# Patient Record
Sex: Female | Born: 1950 | Race: White | Hispanic: No | Marital: Single | Smoking: Never smoker
Health system: Southern US, Community
[De-identification: ages and names within clinical notes are randomized; demographics above are authoritative.]

## PROBLEM LIST (undated history)

## (undated) DIAGNOSIS — I10 Essential (primary) hypertension: Secondary | ICD-10-CM

## (undated) DIAGNOSIS — T8859XA Other complications of anesthesia, initial encounter: Secondary | ICD-10-CM

## (undated) DIAGNOSIS — Z972 Presence of dental prosthetic device (complete) (partial): Secondary | ICD-10-CM

## (undated) DIAGNOSIS — E785 Hyperlipidemia, unspecified: Secondary | ICD-10-CM

## (undated) DIAGNOSIS — I7300 Raynaud's syndrome without gangrene: Secondary | ICD-10-CM

## (undated) DIAGNOSIS — Z87442 Personal history of urinary calculi: Secondary | ICD-10-CM

## (undated) DIAGNOSIS — Z973 Presence of spectacles and contact lenses: Secondary | ICD-10-CM

## (undated) DIAGNOSIS — D649 Anemia, unspecified: Secondary | ICD-10-CM

## (undated) DIAGNOSIS — K219 Gastro-esophageal reflux disease without esophagitis: Secondary | ICD-10-CM

## (undated) DIAGNOSIS — F419 Anxiety disorder, unspecified: Secondary | ICD-10-CM

## (undated) DIAGNOSIS — T4145XA Adverse effect of unspecified anesthetic, initial encounter: Secondary | ICD-10-CM

## (undated) DIAGNOSIS — I739 Peripheral vascular disease, unspecified: Secondary | ICD-10-CM

## (undated) DIAGNOSIS — H269 Unspecified cataract: Secondary | ICD-10-CM

## (undated) DIAGNOSIS — W5911XA Bitten by nonvenomous snake, initial encounter: Secondary | ICD-10-CM

## (undated) DIAGNOSIS — R110 Nausea: Secondary | ICD-10-CM

## (undated) HISTORY — PX: BILATERAL TEMPOROMANDIBULAR JOINT ARTHROPLASTY: SUR77

## (undated) HISTORY — PX: KNEE ARTHROSCOPY: SUR90

## (undated) HISTORY — PX: ABDOMINAL HYSTERECTOMY: SHX81

## (undated) HISTORY — PX: ULNAR NERVE REPAIR: SHX2594

---

## 1999-04-16 ENCOUNTER — Other Ambulatory Visit: Admission: RE | Admit: 1999-04-16 | Discharge: 1999-04-16 | Payer: Self-pay | Admitting: Obstetrics and Gynecology

## 1999-05-14 ENCOUNTER — Ambulatory Visit (HOSPITAL_COMMUNITY): Admission: RE | Admit: 1999-05-14 | Discharge: 1999-05-14 | Payer: Self-pay | Admitting: Neurology

## 1999-05-18 ENCOUNTER — Encounter: Admission: RE | Admit: 1999-05-18 | Discharge: 1999-08-16 | Payer: Self-pay | Admitting: Anesthesiology

## 2000-02-01 ENCOUNTER — Encounter: Admission: RE | Admit: 2000-02-01 | Discharge: 2000-05-01 | Payer: Self-pay | Admitting: Family Medicine

## 2000-04-20 ENCOUNTER — Encounter: Admission: RE | Admit: 2000-04-20 | Discharge: 2000-04-20 | Payer: Self-pay | Admitting: Neurology

## 2000-04-20 ENCOUNTER — Encounter: Payer: Self-pay | Admitting: Neurology

## 2000-05-10 ENCOUNTER — Encounter: Admission: RE | Admit: 2000-05-10 | Discharge: 2000-05-30 | Payer: Self-pay | Admitting: Internal Medicine

## 2000-09-23 ENCOUNTER — Encounter: Admission: RE | Admit: 2000-09-23 | Discharge: 2000-12-22 | Payer: Self-pay | Admitting: Family Medicine

## 2000-12-27 ENCOUNTER — Other Ambulatory Visit: Admission: RE | Admit: 2000-12-27 | Discharge: 2000-12-27 | Payer: Self-pay | Admitting: *Deleted

## 2001-09-19 ENCOUNTER — Encounter: Payer: Self-pay | Admitting: Internal Medicine

## 2001-09-19 ENCOUNTER — Encounter: Admission: RE | Admit: 2001-09-19 | Discharge: 2001-09-19 | Payer: Self-pay | Admitting: Internal Medicine

## 2001-10-06 ENCOUNTER — Encounter: Admission: RE | Admit: 2001-10-06 | Discharge: 2001-10-06 | Payer: Self-pay | Admitting: Internal Medicine

## 2001-10-06 ENCOUNTER — Encounter: Payer: Self-pay | Admitting: Internal Medicine

## 2001-12-06 ENCOUNTER — Encounter: Payer: Self-pay | Admitting: Internal Medicine

## 2001-12-06 ENCOUNTER — Encounter: Admission: RE | Admit: 2001-12-06 | Discharge: 2001-12-06 | Payer: Self-pay | Admitting: Internal Medicine

## 2002-01-16 ENCOUNTER — Other Ambulatory Visit: Admission: RE | Admit: 2002-01-16 | Discharge: 2002-01-16 | Payer: Self-pay | Admitting: Obstetrics and Gynecology

## 2003-01-22 ENCOUNTER — Other Ambulatory Visit: Admission: RE | Admit: 2003-01-22 | Discharge: 2003-01-22 | Payer: Self-pay | Admitting: Obstetrics and Gynecology

## 2004-09-16 ENCOUNTER — Encounter: Admission: RE | Admit: 2004-09-16 | Discharge: 2004-09-16 | Payer: Self-pay | Admitting: Internal Medicine

## 2004-11-30 ENCOUNTER — Ambulatory Visit: Payer: Self-pay

## 2005-04-15 ENCOUNTER — Other Ambulatory Visit: Admission: RE | Admit: 2005-04-15 | Discharge: 2005-04-15 | Payer: Self-pay | Admitting: Obstetrics and Gynecology

## 2005-10-20 ENCOUNTER — Other Ambulatory Visit: Admission: RE | Admit: 2005-10-20 | Discharge: 2005-10-20 | Payer: Self-pay | Admitting: Obstetrics and Gynecology

## 2010-01-15 ENCOUNTER — Ambulatory Visit (HOSPITAL_COMMUNITY): Admission: RE | Admit: 2010-01-15 | Discharge: 2010-01-15 | Payer: Self-pay | Admitting: Interventional Radiology

## 2010-11-02 LAB — CBC
HCT: 39.2 % (ref 36.0–46.0)
MCHC: 34.9 g/dL (ref 30.0–36.0)
RBC: 4.48 MIL/uL (ref 3.87–5.11)
WBC: 8.8 10*3/uL (ref 4.0–10.5)

## 2010-11-02 LAB — BASIC METABOLIC PANEL
Chloride: 104 mEq/L (ref 96–112)
Creatinine, Ser: 0.87 mg/dL (ref 0.4–1.2)
GFR calc Af Amer: >60 mL/min (ref >60)
Sodium: 137 mEq/L (ref 135–145)

## 2010-11-02 LAB — PROTIME-INR: INR: 0.89 (ref 0.00–1.49)

## 2013-06-05 ENCOUNTER — Other Ambulatory Visit: Payer: Self-pay | Admitting: Internal Medicine

## 2013-06-05 ENCOUNTER — Ambulatory Visit
Admission: RE | Admit: 2013-06-05 | Discharge: 2013-06-05 | Disposition: A | Discharge status: Home / Self Care | Payer: Self-pay | Source: Ambulatory Visit | Attending: Internal Medicine | Admitting: Internal Medicine

## 2013-06-05 DIAGNOSIS — M25519 Pain in unspecified shoulder: Secondary | ICD-10-CM

## 2013-12-06 ENCOUNTER — Other Ambulatory Visit: Payer: Self-pay | Admitting: Internal Medicine

## 2013-12-06 ENCOUNTER — Ambulatory Visit
Admission: RE | Admit: 2013-12-06 | Discharge: 2013-12-06 | Disposition: A | Discharge status: Home / Self Care | Payer: 59 | Source: Ambulatory Visit | Attending: Internal Medicine | Admitting: Internal Medicine

## 2013-12-06 DIAGNOSIS — Z9181 History of falling: Secondary | ICD-10-CM

## 2013-12-12 ENCOUNTER — Other Ambulatory Visit: Payer: Self-pay | Admitting: Internal Medicine

## 2013-12-12 ENCOUNTER — Ambulatory Visit
Admission: RE | Admit: 2013-12-12 | Discharge: 2013-12-12 | Disposition: A | Discharge status: Home / Self Care | Payer: 59 | Source: Ambulatory Visit | Attending: Internal Medicine | Admitting: Internal Medicine

## 2013-12-12 DIAGNOSIS — M25559 Pain in unspecified hip: Secondary | ICD-10-CM

## 2013-12-26 ENCOUNTER — Ambulatory Visit
Admission: RE | Admit: 2013-12-26 | Discharge: 2013-12-26 | Disposition: A | Discharge status: Home / Self Care | Payer: 59 | Source: Ambulatory Visit | Attending: Internal Medicine | Admitting: Internal Medicine

## 2013-12-26 ENCOUNTER — Other Ambulatory Visit: Payer: Self-pay | Admitting: Internal Medicine

## 2013-12-26 DIAGNOSIS — M549 Dorsalgia, unspecified: Secondary | ICD-10-CM

## 2013-12-30 ENCOUNTER — Inpatient Hospital Stay (HOSPITAL_COMMUNITY)
Admission: EM | Admit: 2013-12-30 | Discharge: 2014-01-01 | DRG: 918 | Disposition: A | Discharge status: Home / Self Care | Payer: 59 | Attending: Internal Medicine | Admitting: Internal Medicine

## 2013-12-30 ENCOUNTER — Encounter (HOSPITAL_COMMUNITY): Payer: Self-pay | Admitting: Internal Medicine

## 2013-12-30 DIAGNOSIS — T6391XA Toxic effect of contact with unspecified venomous animal, accidental (unintentional), initial encounter: Principal | ICD-10-CM | POA: Diagnosis present

## 2013-12-30 DIAGNOSIS — IMO0002 Reserved for concepts with insufficient information to code with codable children: Secondary | ICD-10-CM

## 2013-12-30 DIAGNOSIS — T63001A Toxic effect of unspecified snake venom, accidental (unintentional), initial encounter: Secondary | ICD-10-CM | POA: Diagnosis present

## 2013-12-30 DIAGNOSIS — Y92009 Unspecified place in unspecified non-institutional (private) residence as the place of occurrence of the external cause: Secondary | ICD-10-CM

## 2013-12-30 DIAGNOSIS — E785 Hyperlipidemia, unspecified: Secondary | ICD-10-CM | POA: Diagnosis present

## 2013-12-30 DIAGNOSIS — Z79899 Other long term (current) drug therapy: Secondary | ICD-10-CM

## 2013-12-30 DIAGNOSIS — M545 Low back pain, unspecified: Secondary | ICD-10-CM | POA: Diagnosis present

## 2013-12-30 DIAGNOSIS — R112 Nausea with vomiting, unspecified: Secondary | ICD-10-CM | POA: Diagnosis present

## 2013-12-30 DIAGNOSIS — T63121A Toxic effect of venom of other venomous lizard, accidental (unintentional), initial encounter: Secondary | ICD-10-CM | POA: Diagnosis present

## 2013-12-30 DIAGNOSIS — W5911XA Bitten by nonvenomous snake, initial encounter: Secondary | ICD-10-CM | POA: Diagnosis present

## 2013-12-30 DIAGNOSIS — E876 Hypokalemia: Secondary | ICD-10-CM | POA: Diagnosis present

## 2013-12-30 DIAGNOSIS — Z7982 Long term (current) use of aspirin: Secondary | ICD-10-CM

## 2013-12-30 HISTORY — DX: Other complications of anesthesia, initial encounter: T88.59XA

## 2013-12-30 HISTORY — DX: Gastro-esophageal reflux disease without esophagitis: K21.9

## 2013-12-30 HISTORY — DX: Hyperlipidemia, unspecified: E78.5

## 2013-12-30 HISTORY — DX: Anemia, unspecified: D64.9

## 2013-12-30 HISTORY — DX: Adverse effect of unspecified anesthetic, initial encounter: T41.45XA

## 2013-12-30 HISTORY — DX: Bitten by nonvenomous snake, initial encounter: W59.11XA

## 2013-12-30 LAB — CBC WITH DIFFERENTIAL/PLATELET
BASOS ABS: 0 10*3/uL (ref 0.0–0.1)
BASOS PCT: 0 % (ref 0–1)
EOS ABS: 0 10*3/uL (ref 0.0–0.7)
Eosinophils Relative: 0 % (ref 0–5)
HCT: 38.7 % (ref 36.0–46.0)
Hemoglobin: 13.1 g/dL (ref 12.0–15.0)
LYMPHS ABS: 2.7 10*3/uL (ref 0.7–4.0)
Lymphocytes Relative: 23 % (ref 12–46)
MCH: 29.8 pg (ref 26.0–34.0)
MCHC: 33.9 g/dL (ref 30.0–36.0)
MCV: 88 fL (ref 78.0–100.0)
MONO ABS: 0.7 10*3/uL (ref 0.1–1.0)
MONOS PCT: 6 % (ref 3–12)
NEUTROS ABS: 8.2 10*3/uL — AB (ref 1.7–7.7)
NEUTROS PCT: 71 % (ref 43–77)
PLATELETS: 211 10*3/uL (ref 150–400)
RBC: 4.4 MIL/uL (ref 3.87–5.11)
RDW: 12.6 % (ref 11.5–15.5)
WBC: 11.6 10*3/uL — ABNORMAL HIGH (ref 4.0–10.5)

## 2013-12-30 LAB — COMPREHENSIVE METABOLIC PANEL
ALBUMIN: 3.9 g/dL (ref 3.5–5.2)
ALT: 22 U/L (ref 0–35)
AST: 20 U/L (ref 0–37)
Alkaline Phosphatase: 62 U/L (ref 39–117)
BILIRUBIN TOTAL: 0.2 mg/dL — AB (ref 0.3–1.2)
BUN: 27 mg/dL — ABNORMAL HIGH (ref 6–23)
CO2: 25 mEq/L (ref 19–32)
Calcium: 9 mg/dL (ref 8.4–10.5)
Chloride: 107 mEq/L (ref 96–112)
Creatinine, Ser: 1.11 mg/dL — ABNORMAL HIGH (ref 0.50–1.10)
GFR calc Af Amer: 59 mL/min — ABNORMAL LOW (ref >90)
GFR calc non Af Amer: 51 mL/min — ABNORMAL LOW (ref >90)
GLUCOSE: 90 mg/dL (ref 70–99)
Potassium: 3 mEq/L — ABNORMAL LOW (ref 3.7–5.3)
SODIUM: 147 meq/L (ref 137–147)
TOTAL PROTEIN: 6.6 g/dL (ref 6.0–8.3)

## 2013-12-30 LAB — FIBRINOGEN: FIBRINOGEN: 209 mg/dL (ref 204–475)

## 2013-12-30 LAB — CK: CK TOTAL: 162 U/L (ref 7–177)

## 2013-12-30 LAB — D-DIMER, QUANTITATIVE: D-Dimer, Quant: 0.33 ug/mL-FEU (ref 0.00–0.48)

## 2013-12-30 LAB — PROTIME-INR
INR: 0.96 (ref 0.00–1.49)
PROTHROMBIN TIME: 12.6 s (ref 11.6–15.2)

## 2013-12-30 MED ORDER — SODIUM CHLORIDE 0.9 % IV BOLUS (SEPSIS)
1000.0000 mL | Freq: Once | INTRAVENOUS | Status: AC
  Administered 2013-12-30: 1000 mL via INTRAVENOUS

## 2013-12-30 MED ORDER — ALBUTEROL SULFATE (2.5 MG/3ML) 0.083% IN NEBU: 2.5000 mL | INHALATION_SOLUTION | Freq: Four times a day (QID) | RESPIRATORY_TRACT | Status: DC | PRN

## 2013-12-30 MED ORDER — ONDANSETRON HCL 4 MG/2ML IJ SOLN
4.0000 mg | Freq: Once | INTRAMUSCULAR | Status: AC
  Administered 2013-12-30: 4 mg via INTRAVENOUS
  Filled 2013-12-30: qty 2

## 2013-12-30 MED ORDER — HYDROMORPHONE HCL PF 1 MG/ML IJ SOLN
1.0000 mg | Freq: Once | INTRAMUSCULAR | Status: AC
  Administered 2013-12-30: 1 mg via INTRAVENOUS
  Filled 2013-12-30: qty 1

## 2013-12-30 MED ORDER — ACETAMINOPHEN 650 MG RE SUPP: 650.0000 mg | Freq: Four times a day (QID) | RECTAL | Status: DC | PRN

## 2013-12-30 MED ORDER — TETANUS-DIPHTHERIA TOXOIDS TD 5-2 LFU IM INJ
0.5000 mL | INJECTION | Freq: Once | INTRAMUSCULAR | Status: AC
  Administered 2013-12-30: 0.5 mL via INTRAMUSCULAR
  Filled 2013-12-30: qty 0.5

## 2013-12-30 MED ORDER — SODIUM CHLORIDE 0.9 % IV SOLN
1000.0000 mL | INTRAVENOUS | Status: AC
  Administered 2013-12-30: 1000 mL via INTRAVENOUS

## 2013-12-30 MED ORDER — SODIUM CHLORIDE 0.9 % IV SOLN
4.0000 | Freq: Once | INTRAVENOUS | Status: AC
  Administered 2013-12-30: 72 mL via INTRAVENOUS
  Filled 2013-12-30: qty 72

## 2013-12-30 MED ORDER — METOCLOPRAMIDE HCL 5 MG/ML IJ SOLN
10.0000 mg | Freq: Once | INTRAMUSCULAR | Status: AC
  Administered 2013-12-30: 10 mg via INTRAVENOUS
  Filled 2013-12-30: qty 2

## 2013-12-30 MED ORDER — HYDROMORPHONE HCL PF 1 MG/ML IJ SOLN: 1.0000 mg | INTRAMUSCULAR | Status: DC | PRN

## 2013-12-30 MED ORDER — ONDANSETRON HCL 4 MG PO TABS: 4.0000 mg | ORAL_TABLET | Freq: Four times a day (QID) | ORAL | Status: DC | PRN

## 2013-12-30 MED ORDER — PREDNISONE 20 MG PO TABS
40.0000 mg | ORAL_TABLET | Freq: Every day | ORAL | Status: DC
  Filled 2013-12-30 (×2): qty 2

## 2013-12-30 MED ORDER — PROMETHAZINE HCL 25 MG PO TABS: 12.5000 mg | ORAL_TABLET | Freq: Four times a day (QID) | ORAL | Status: DC | PRN

## 2013-12-30 MED ORDER — SODIUM CHLORIDE 0.9 % IV SOLN: 4.0000 | Freq: Once | INTRAVENOUS | Status: DC

## 2013-12-30 MED ORDER — HYDROCODONE-ACETAMINOPHEN 5-325 MG PO TABS
1.0000 | ORAL_TABLET | ORAL | Status: DC | PRN
  Administered 2013-12-31 (×2): 1 via ORAL
  Filled 2013-12-30 (×2): qty 1

## 2013-12-30 MED ORDER — ACETAMINOPHEN 325 MG PO TABS: 650.0000 mg | ORAL_TABLET | Freq: Four times a day (QID) | ORAL | Status: DC | PRN

## 2013-12-30 MED ORDER — ONDANSETRON HCL 4 MG/2ML IJ SOLN: 4.0000 mg | Freq: Four times a day (QID) | INTRAMUSCULAR | Status: DC | PRN

## 2013-12-30 MED ORDER — SODIUM CHLORIDE 0.9 % IJ SOLN: 3.0000 mL | Freq: Two times a day (BID) | INTRAMUSCULAR | Status: DC

## 2013-12-30 MED ORDER — PROMETHAZINE HCL 25 MG/ML IJ SOLN
12.5000 mg | Freq: Four times a day (QID) | INTRAMUSCULAR | Status: DC | PRN
  Administered 2013-12-31: 12.5 mg via INTRAVENOUS
  Filled 2013-12-30: qty 1

## 2013-12-30 MED ORDER — PROMETHAZINE HCL 25 MG RE SUPP: 12.5000 mg | Freq: Four times a day (QID) | RECTAL | Status: DC | PRN

## 2013-12-30 MED ORDER — POTASSIUM CHLORIDE 10 MEQ/100ML IV SOLN
10.0000 meq | INTRAVENOUS | Status: AC
  Administered 2013-12-30 – 2013-12-31 (×2): 10 meq via INTRAVENOUS
  Filled 2013-12-30 (×3): qty 100

## 2013-12-30 NOTE — ED Provider Notes (Signed)
I saw and evaluated the patient, reviewed the resident's note and I agree with the findings and plan.   EKG Interpretation   Date/Time:  Sunday Dec 30 2013 18:01:26 EDT Ventricular Rate:  67 PR Interval:  120 QRS Duration: 87 QT Interval:  407 QTC Calculation: 430 R Axis:   64 Text Interpretation:  Sinus rhythm No old tracing to compare Confirmed by  [facility]  MD, TREY ([id number]) on 12/30/2013 7:03:04 PM      CRITICAL CARE Performed by: Candyce ChurnJohn David Caidence Higashi III   Total critical care time: 30  Critical care time was exclusive of separately billable procedures and treating other patients.  Critical care was necessary to treat or prevent imminent or life-threatening deterioration.  Critical care was time spent personally by me on the following activities: development of treatment plan with patient and/or surrogate as well as nursing, discussions with consultants, evaluation of patient's response to treatment, examination of patient, obtaining history from patient or surrogate, ordering and performing treatments and interventions, ordering and review of laboratory studies, ordering and review of radiographic studies, pulse oximetry and re-evaluation of patient's condition.   63 yo female who was bit by a snake, thought to be a copperhead, shortly before arrival. Exam, small puncture wound to the dorsal aspect of right hand, associated swelling of hand fingers and distal forearm. 2+ radial pulses, sensation intact, motor intact. Plan to observe, monitor swelling, consider CroFab if symptoms progress.  Swelling progressed. Required CroFab. Admitted.  Clinical Impression: 1. Hyperlipidemia   2. Snake bite   3. Hypokalemia   4. Nausea and vomiting       Candyce ChurnJohn David Mekiah Cambridge III, MD 12/31/13 [phone]

## 2013-12-30 NOTE — ED Provider Notes (Signed)
CSN: [id number]     Arrival date & time 12/30/13  1745 History   First MD Initiated Contact with Patient 12/30/13 1746     Chief Complaint  Patient presents with  . Snake Bite     (Consider location/radiation/quality/duration/timing/severity/associated sxs/prior Treatment) Patient is a 63 y.o. female presenting with animal bite. The history is provided by the patient and medical records. No language interpreter was used.  Animal Bite Contact animal:  Snake Location:  Hand Hand injury location:  R hand Time since incident:  1 hour Pain details:    Quality:  Aching   Severity:  Severe   Timing:  Constant   Progression:  Worsening Incident location:  Home Provoked: unprovoked   Notifications:  None Tetanus status:  Out of date Relieved by:  Nothing Worsened by:  Activity Ineffective treatments:  Rest Associated symptoms: numbness and swelling   Associated symptoms: no fever     No past medical history on file. No past surgical history on file. No family history on file. History  Substance Use Topics  . Smoking status: Not on file  . Smokeless tobacco: Not on file  . Alcohol Use: Not on file   OB History   No data available     Review of Systems  Constitutional: Negative for fever.  Neurological: Positive for numbness.  All other systems reviewed and are negative.     Allergies  Review of patient's allergies indicates not on file.  Home Medications   Prior to Admission medications   Not on File   SpO2 98% Physical Exam  Nursing note and vitals reviewed. Constitutional: She is oriented to person, place, and time. She appears well-developed and well-nourished.  HENT:  Head: Normocephalic and atraumatic.  Right Ear: External ear normal.  Left Ear: External ear normal.  Eyes: Conjunctivae are normal. Pupils are equal, round, and reactive to light.  Neck: Normal range of motion. Neck supple.  Cardiovascular: Normal rate, normal heart sounds and intact  distal pulses.   Pulmonary/Chest: Effort normal and breath sounds normal.  Abdominal: Soft. Bowel sounds are normal.  Musculoskeletal:  Right hand - to dorsum of hand (web space) there are 2 small puncture wounds, appx 2 mm, c/w bite.  There is TTP and moderate edema to level of mid forearm. 2+ distal pulses and normal sensation.    Neurological: She is alert and oriented to person, place, and time.    ED Course  Procedures (including critical care time) Labs Review Labs Reviewed  CBC WITH DIFFERENTIAL - Abnormal; Notable for the following:    WBC 11.6 (*)    Neutro Abs 8.2 (*)    All other components within normal limits  COMPREHENSIVE METABOLIC PANEL - Abnormal; Notable for the following:    Potassium 3.0 (*)    BUN 27 (*)    Creatinine, Ser 1.11 (*)    Total Bilirubin 0.2 (*)    GFR calc non Af Amer 51 (*)    GFR calc Af Amer 59 (*)    All other components within normal limits  PROTIME-INR  CK  D-DIMER, QUANTITATIVE  FIBRINOGEN  CBC  D-DIMER, QUANTITATIVE  PROTIME-INR    Imaging Review No results found.   EKG Interpretation   Date/Time:  Sunday Dec 30 2013 18:01:26 EDT Ventricular Rate:  67 PR Interval:  120 QRS Duration: 87 QT Interval:  407 QTC Calculation: 430 R Axis:   64 Text Interpretation:  Sinus rhythm No old tracing to compare Confirmed by  [facility]  MD, Thurston PoundsREY [phone]([id number]) on 12/30/2013 7:03:04 PM         Date: 12/30/2013  Rate: 67  Rhythm: normal sinus rhythm  QRS Axis: normal  Intervals: normal  ST/T Wave abnormalities: normal  Conduction Disutrbances:none  Narrative Interpretation:   Old EKG Reviewed: none available   MDM   Final diagnoses:  Hyperlipidemia  Snake bite  Hypokalemia  Nausea and vomiting    Patient presents to the emergency department with complaints of right upper extremity pain/swelling after a snake bite. Family has picture of snake and it is consistent with copperhead. Vital signs were unremarkable including no hypoxia,  no fever, and no tachycardia. Physical exam as above and remarkable for marked edema and tenderness to palpation of the right upper extremity.  Patient was observed in the emergency department and she was noted to have increasing amounts of swelling, tenderness to palpation, and ecchymosis. Poison control made aware of patient's presentation and it was thought that CroFab was appropriate. Patient will be admitted to the hospitalist for further monitoring and serial blood work.      Johnney Ouerek Latonya Nelon, MD 12/31/13 [phone]

## 2013-12-30 NOTE — ED Notes (Addendum)
Pt tolerated first ten minutes of CROFAB well. No new signs/symptoms.

## 2013-12-30 NOTE — ED Notes (Addendum)
WOUND MEASUREMENTS: 2100 Hand 20.5cm Wrist 16.5cm Forearm 25cm Bicep 30cm 2200 Hand 20.5cm Wrist 16.5cm Forearm 23cm Bicep 30.5cm 2250 Hand 20.0cm Wrist 16.5cm Forearm 23.5cm Bicep 31cm

## 2013-12-30 NOTE — Progress Notes (Signed)
Pt arrive to room 2w17 with RN. Per emergency room report if pt experiences increased swelling, I am to contact Poison Control first for suggestions before contacting MD for orders based on Poison Control suggestions. Pt family at bedside, call light in reach, RN will continue to monitor.   Thane EduKimberly Hecker, RN

## 2013-12-30 NOTE — ED Notes (Signed)
[facility] with c/o copperhead snake bite while moving wood at around 5 pm.  EMS has swelling marked and timed.  Pt reports throbbing 10/10 pain, A&O, in NAD.

## 2013-12-30 NOTE — H&P (Signed)
Patient's PCP: Alyssa James,KARRAR, Alyssa James  Chief Complaint: Snake bite  History of Present Illness: Alyssa James is a 63 y.o. Caucasian female this history of hyperlipidemia which is diet controlled and recent history of fall with low back pain and possible nerve impingement on steroids who presents with the above complaints.  Patient reports that her symptoms started at 5 p.m. today while she was in her backyard, she was lifting a tarp on some wood, a snake surprised her and bit her right hand on the dorsum.  Patient killed the snake and took pictures, picture suggestive of a copperhead snake.  She presented to the emergency department for further evaluation.  In the emergency department, due to her right upper extremity swelling she was given a dose of crofab. Hospitalist service was asked to admit the patient for further care and management.  Patient denies any recent fevers, chills, nausea, vomiting, chest pain, shortness of breath, abdominal pain, diarrhea, headaches or vision changes prior to the snake bite.  Subsequently after snake bite, she has had nausea and vomiting.  She also has had increased swelling of her right upper extremity.  Of note, she has fallen about one month ago and has subsequent low back pain and has been started on prednisone on 12/26/2013 by her primary care physician.  Review of Systems: All systems reviewed with the patient and positive as per history of present illness, otherwise all other systems are negative.  Past Medical History  Diagnosis Date  . Hyperlipidemia    History reviewed. No pertinent past surgical history. Family History  Problem Relation Age of Onset  . Leukemia Mother   . Scoliosis Mother   . Arthritis Mother   . Heart attack Brother   . Heart attack Brother    History   Social History  . Marital Status: Single    Spouse Name: N/A    Number of Children: N/A  . Years of Education: N/A   Occupational History  . Not on file.   Social History  Main Topics  . Smoking status: Never Smoker   . Smokeless tobacco: Not on file  . Alcohol Use: No  . Drug Use: Not on file  . Sexual Activity: Not on file   Other Topics Concern  . Not on file   Social History Narrative  . No narrative on file   Allergies: Aspirin; Codeine; Sulfa antibiotics; Tetracyclines & related; and Penicillins  Home Meds: Prior to Admission medications   Medication Sig Start Date End Date Taking? Authorizing Provider  albuterol (PROVENTIL HFA;VENTOLIN HFA) 108 (90 BASE) MCG/ACT inhaler Inhale 2 puffs into the lungs every 6 (six) hours as needed for wheezing or shortness of breath.   Yes Historical Provider, Alyssa James  aspirin 81 MG chewable tablet Chew by mouth daily.   Yes Historical Provider, Alyssa James  Cholecalciferol (VITAMIN D3 ADULT GUMMIES PO) Take 1 tablet by mouth daily.   Yes Historical Provider, Alyssa James  ibuprofen (ADVIL,MOTRIN) 800 MG tablet Take 800 mg by mouth every 8 (eight) hours as needed for headache.   Yes Historical Provider, Alyssa James  OVER THE COUNTER MEDICATION Take 1 tablet by mouth daily. Calcium gummies   Yes Historical Provider, Alyssa James  OVER THE COUNTER MEDICATION Take 1 tablet by mouth daily. Fish oil gummies   Yes Historical Provider, Alyssa James  PREDNISONE PO Take by mouth.   Yes Historical Provider, Alyssa James    Physical Exam: Blood pressure 154/93, pulse 69, temperature 98 F (36.7 C), temperature source Oral, resp. rate 18, height  5\' 1"  (1.549 m), weight 57.607 kg (127 lb), SpO2 98.00%. General: Awake, Oriented x3, No acute distress. HEENT: EOMI, Moist mucous membranes Neck: Supple CV: S1 and S2 Lungs: Clear to ascultation bilaterally Abdomen: Soft, Nontender, Nondistended, +bowel sounds. Ext: Good pulses. No clubbing or cyanosis noted.  Right upper extremity swelling all the way to the axilla.  2 puncture wounds noted on the right dorsum of the hand. Neuro: Cranial Nerves II-XII grossly intact. Has 5/5 motor strength in upper and lower extremities.  Lab  results:  Recent Labs  12/30/13 1827  NA 147  K 3.0*  CL 107  CO2 25  GLUCOSE 90  BUN 27*  CREATININE 1.11*  CALCIUM 9.0    Recent Labs  12/30/13 1827  AST 20  ALT 22  ALKPHOS 62  BILITOT 0.2*  PROT 6.6  ALBUMIN 3.9   No results found for this basename: LIPASE, AMYLASE,  in the last 72 hours  Recent Labs  12/30/13 1827  WBC 11.6*  NEUTROABS 8.2*  HGB 13.1  HCT 38.7  MCV 88.0  PLT 211    Recent Labs  12/30/13 1827  CKTOTAL 162   No components found with this basename: POCBNP,   Recent Labs  12/30/13 1827  DDIMER 0.33   No results found for this basename: HGBA1C,  in the last 72 hours No results found for this basename: CHOL, HDL, LDLCALC, TRIG, CHOLHDL, LDLDIRECT,  in the last 72 hours No results found for this basename: TSH, T4TOTAL, FREET3, T3FREE, THYROIDAB,  in the last 72 hours No results found for this basename: VITAMINB12, FOLATE, FERRITIN, TIBC, IRON, RETICCTPCT,  in the last 72 hours Imaging results:  Dg Lumbar Spine 2-3 Views  12/26/2013   CLINICAL DATA:  Larey SeatFell and a pleural with left hip pain  EXAM: LUMBAR SPINE - 2-3 VIEW  COMPARISON:  None.  FINDINGS: The lumbar vertebrae are in normal alignment. Intervertebral disc spaces appear normal. There does appear to be degenerative change involving the facet joints particularly of L3-4 and L4-5, and L5-S1 levels. The SI joints are corticated.  IMPRESSION: Normal alignment with normal disc spaces. Degenerative change of the facet joints of the lower lumbar spine.   Electronically Signed   By: Dwyane DeePaul  Barry M.D.   On: 12/26/2013 16:01   Dg Pelvis 1-2 Views  12/06/2013   CLINICAL DATA:  Pelvic pain following fall.  EXAM: PELVIS - 1-2 VIEW  COMPARISON:  None.  FINDINGS: There is no evidence of pelvic fracture or diastasis. No other pelvic bone lesions are seen.  IMPRESSION: Negative.   Electronically Signed   By: Laveda AbbeJeff  Hu M.D.   On: 12/06/2013 15:49   Dg Hip Complete Left  12/12/2013   CLINICAL DATA:  Larey SeatFell 2  weeks ago with pain  EXAM: LEFT HIP - COMPLETE 2+ VIEW  COMPARISON:  None.  FINDINGS: No acute fracture is seen. The left hip joint space is unremarkable. The left pelvic ramus is intact.  IMPRESSION: Negative.   Electronically Signed   By: Dwyane DeePaul  Barry M.D.   On: 12/12/2013 14:34   Other results: EKG: Sinus rhythm with heart rate 67.  Assessment & Plan by Problem: Snake bite of right upper extremity Case discussed with poison control.  Continue right upper extremity hand measurements as per poison control.  Patient has received 1 dose of CroFab.  Will check CBC, d-dimer, and INR 6 hours later.  Nursing staff to check right upper extremity measurements every one hour, if any increasing swelling then  will administer another dose of CroFab.  Patient to elevate her arm and straight as possible above the heart.  Poison control recommended monitoring the patient on telemetry.  Control pain with dialudid and hydrocodone as per poison control.  Nausea and vomiting Due to above.  Control symptoms with antiemetics.  Clear liquid diet for now, if nausea and vomiting resolves by tomorrow and start regular diet.  Hypokalemia May be due to snake bite.  Replace as needed.  Hyperlipidemia Appears to be diet controlled at home.  Not an active issue at this time.  Low back pain from a fall one month ago Continue steroids prescribed by her primary care physician.  Steroid titration as per her primary care physician.  Patient indicates that she has had multiple imaging which have all been negative for any fractures.  Prophylaxis SCDs.  No heparin given concern for possible bleeding from the snake bite.  CODE STATUS Full code.  Disposition Admit the patient to telemetry as inpatient.  In the morning, patient's care will be transferred to her primary care physician Dr. Gloris Ham.  Time spent on admission, talking to the patient, and coordinating care was: 60 mins.  Cristal Ford, Alyssa James 12/30/2013, 10:42 PM

## 2013-12-31 ENCOUNTER — Encounter (HOSPITAL_COMMUNITY): Payer: Self-pay | Admitting: General Practice

## 2013-12-31 LAB — BASIC METABOLIC PANEL
BUN: 17 mg/dL (ref 6–23)
CHLORIDE: 107 meq/L (ref 96–112)
CO2: 25 meq/L (ref 19–32)
Calcium: 7.2 mg/dL — ABNORMAL LOW (ref 8.4–10.5)
Creatinine, Ser: 0.82 mg/dL (ref 0.50–1.10)
GFR calc Af Amer: 86 mL/min — ABNORMAL LOW (ref >90)
GFR calc non Af Amer: 74 mL/min — ABNORMAL LOW (ref >90)
Glucose, Bld: 85 mg/dL (ref 70–99)
POTASSIUM: 3.6 meq/L — AB (ref 3.7–5.3)
Sodium: 142 mEq/L (ref 137–147)

## 2013-12-31 LAB — CBC
HCT: 35.5 % — ABNORMAL LOW (ref 36.0–46.0)
HEMATOCRIT: 36.5 % (ref 36.0–46.0)
HEMOGLOBIN: 11.7 g/dL — AB (ref 12.0–15.0)
Hemoglobin: 12.1 g/dL (ref 12.0–15.0)
MCH: 29.4 pg (ref 26.0–34.0)
MCH: 29.5 pg (ref 26.0–34.0)
MCHC: 33 g/dL (ref 30.0–36.0)
MCHC: 33.2 g/dL (ref 30.0–36.0)
MCV: 89.2 fL (ref 78.0–100.0)
MCV: 89 fL (ref 78.0–100.0)
Platelets: 212 10*3/uL (ref 150–400)
Platelets: 204 10*3/uL (ref 150–400)
RBC: 3.98 MIL/uL (ref 3.87–5.11)
RBC: 4.1 MIL/uL (ref 3.87–5.11)
RDW: 12.9 % (ref 11.5–15.5)
RDW: 13 % (ref 11.5–15.5)
WBC: 11.2 10*3/uL — ABNORMAL HIGH (ref 4.0–10.5)
WBC: 10 10*3/uL (ref 4.0–10.5)

## 2013-12-31 LAB — D-DIMER, QUANTITATIVE: D-Dimer, Quant: 0.47 ug/mL-FEU (ref 0.00–0.48)

## 2013-12-31 LAB — PROTIME-INR
INR: 1.02 (ref 0.00–1.49)
PROTHROMBIN TIME: 13.2 s (ref 11.6–15.2)

## 2013-12-31 MED ORDER — POTASSIUM CHLORIDE CRYS ER 20 MEQ PO TBCR
20.0000 meq | EXTENDED_RELEASE_TABLET | Freq: Once | ORAL | Status: AC
  Administered 2013-12-31: 20 meq via ORAL
  Filled 2013-12-31: qty 1

## 2013-12-31 NOTE — Progress Notes (Addendum)
2031 Hand 20 cm  Wrist 16.5 cm  Forearm 23.5 cm  Bicep 30 cm 0016 Hand 20 Wrist 16.5 Forearm 22.5 Bicep 30 cm 0409 Hand 20 cm Wrist 16.5 cm Forearm 23 cm Bicep 30 cm

## 2013-12-31 NOTE — Progress Notes (Signed)
RN contact Poison Control to talk about increase of 9 cm in pt bicep measurement.  Representative state it is expected to increase some and there is no "magic number" in increased girth of area that brings concern just monitoring the pts symptoms at this time.  Suggested that we increase elevation of pt's arm and reevaluate in hour and half to 2 hours to see if there is any change in swelling.  Pt is asymptomatic at this time as long as arm is not being moved or bothered at this time.  Family at bedside, call light in reach, RN will continue to monitor.   Thane EduKimberly Hecker, RN

## 2013-12-31 NOTE — ED Provider Notes (Signed)
I saw and evaluated the patient, reviewed the resident's note and I agree with the findings and plan.   EKG Interpretation   Date/Time:  Sunday Dec 30 2013 18:01:26 EDT Ventricular Rate:  67 PR Interval:  120 QRS Duration: 87 QT Interval:  407 QTC Calculation: 430 R Axis:   64 Text Interpretation:  Sinus rhythm No old tracing to compare Confirmed by  [facility]  [facility], TREY ([id number]) on 12/30/2013 7:03:04 PM        Candyce ChurnJohn David Tyffani Foglesong III, MD 12/31/13 1246

## 2013-12-31 NOTE — Progress Notes (Addendum)
1046 Hand 20.5 cm Wrist 16.5 cm Forearm 25.5 cm Bicep 30.5 cm 1421 Hand 20 cm Wrist 16.5 Forearm 25 cm Bicep 32 cm 1735 Hand 20 cm Wrist 16.5 cm Forearm 24 cm Bicep 31 cm

## 2013-12-31 NOTE — Progress Notes (Signed)
Subjective: Right arm pain better- still with swelling No N/V  Objective: Vital signs in last 24 hours: Temp:  [97.5 F (36.4 C)-98 F (36.7 C)] 97.5 F (36.4 C) (05/17 2355) Pulse Rate:  [59-82] 62 (05/17 2355) Resp:  [8-19] 16 (05/17 2355) BP: (118-170)/(71-123) 155/92 mmHg (05/17 2355) SpO2:  [88 %-100 %] 98 % (05/17 2355) Weight:  [57.607 kg (127 lb)-57.834 kg (127 lb 8 oz)] 57.834 kg (127 lb 8 oz) (05/17 2355) Weight change:  Last BM Date: 12/30/13  Intake/Output from previous day:   Intake/Output this shift:    General appearance: alert Resp: clear to auscultation bilaterally Cardio: regular rate and rhythm Extremities: right arm swelling- able to move fingers  Lab Results:  Recent Labs  12/30/13 1827 12/31/13 0400  WBC 11.6* 11.2*  HGB 13.1 11.7*  HCT 38.7 35.5*  PLT 211 212   BMET  Recent Labs  12/30/13 1827 12/31/13 0400  NA 147 142  K 3.0* 3.6*  CL 107 107  CO2 25 25  GLUCOSE 90 85  BUN 27* 17  CREATININE 1.11* 0.82  CALCIUM 9.0 7.2*    Studies/Results: No results found.  Medications: I have reviewed the patient's current medications.  Assessment/Plan: Snake bite to right arm- with local reaction- s/p dose of CroFab; poison control for further recommendation; continue supportive care - elevate arm- neurovascular ok-  Hand ortho to eval Low K- replace Back pain- was on prednisone at home- hold off due to risk for healing- snake bite- per poison control Clear diet with advance as tolerated   LOS: 1 day   Georgann HousekeeperKarrar Kendria Halberg 12/31/2013, 7:33 AM

## 2013-12-31 NOTE — Progress Notes (Signed)
Spoke with poison control via telephone-poison control suggested: No repeat of labs; no ice; pain management; optimize elevation; will follow up later in the day.  Needed for hospitalization at least 6 hours post bite and treatment; ok to discharge if patient is able to keep extremity elevated and feels comfortable with discharge; poison control asks to call with any questions about discharging patient.  Judeth CornfieldStephanie (name of contact spoken to at [phone])  Park BreedHeather M Barnett, RN

## 2013-12-31 NOTE — Progress Notes (Signed)
Spoke with representative from Poison Control to give up date.  Pt resting with arm elevated on pillows with elbow bent at 30-45 degree angle.  Swelling has not increased.  Per Poison Control, will have pt added to list for follow up in the morning.   Thane EduKimberly Hecker, RN

## 2013-12-31 NOTE — Consult Note (Signed)
Reason for Consult:right hand snakebite Referring Physician: [name] is an 63 y.o. female.  HPI: as above s/p snakebite to right hand on 5/17 with pain and swelling  Past Medical History  Diagnosis Date  . Hyperlipidemia     History reviewed. No pertinent past surgical history.  Family History  Problem Relation Age of Onset  . Leukemia Mother   . Scoliosis Mother   . Arthritis Mother   . Heart attack Brother   . Heart attack Brother     Social History:  reports that she has never smoked. She does not have any smokeless tobacco history on file. She reports that she does not drink alcohol. Her drug history is not on file.  Allergies:  Allergies  Allergen Reactions  . Aspirin Other (See Comments)    Unknown reaction.  Takes baby aspirin now and it's fine  . Codeine Other (See Comments)    Headache  . Sulfa Antibiotics Nausea And Vomiting  . Tetracyclines & Related Nausea And Vomiting and Other (See Comments)    Headache  . Penicillins Rash    Medications:  Scheduled: . sodium chloride  3 mL Intravenous Q12H    Results for orders placed during the hospital encounter of 12/30/13 (from the past 48 hour(s))  CBC WITH DIFFERENTIAL     Status: Abnormal   Collection Time    12/30/13  6:27 PM      Result Value Ref Range   WBC 11.6 (*) 4.0 - 10.5 K/uL   RBC 4.40  3.87 - 5.11 MIL/uL   Hemoglobin 13.1  12.0 - 15.0 g/dL   HCT 38.7  36.0 - 46.0 %   MCV 88.0  78.0 - 100.0 fL   MCH 29.8  26.0 - 34.0 pg   MCHC 33.9  30.0 - 36.0 g/dL   RDW 12.6  11.5 - 15.5 %   Platelets 211  150 - 400 K/uL   Neutrophils Relative % 71  43 - 77 %   Neutro Abs 8.2 (*) 1.7 - 7.7 K/uL   Lymphocytes Relative 23  12 - 46 %   Lymphs Abs 2.7  0.7 - 4.0 K/uL   Monocytes Relative 6  3 - 12 %   Monocytes Absolute 0.7  0.1 - 1.0 K/uL   Eosinophils Relative 0  0 - 5 %   Eosinophils Absolute 0.0  0.0 - 0.7 K/uL   Basophils Relative 0  0 - 1 %   Basophils Absolute 0.0  0.0 - 0.1 K/uL   COMPREHENSIVE METABOLIC PANEL     Status: Abnormal   Collection Time    12/30/13  6:27 PM      Result Value Ref Range   Sodium 147  137 - 147 mEq/L   Potassium 3.0 (*) 3.7 - 5.3 mEq/L   Chloride 107  96 - 112 mEq/L   CO2 25  19 - 32 mEq/L   Glucose, Bld 90  70 - 99 mg/dL   BUN 27 (*) 6 - 23 mg/dL   Creatinine, Ser 1.11 (*) 0.50 - 1.10 mg/dL   Calcium 9.0  8.4 - 10.5 mg/dL   Total Protein 6.6  6.0 - 8.3 g/dL   Albumin 3.9  3.5 - 5.2 g/dL   AST 20  0 - 37 U/L   ALT 22  0 - 35 U/L   Alkaline Phosphatase 62  39 - 117 U/L   Total Bilirubin 0.2 (*) 0.3 - 1.2 mg/dL   GFR calc non  Af Amer 51 (*) >90 mL/min   GFR calc Af Amer 59 (*) >90 mL/min   Comment: (NOTE)     The eGFR has been calculated using the CKD EPI equation.     This calculation has not been validated in all clinical situations.     eGFR's persistently <90 mL/min signify possible Chronic Kidney     Disease.  PROTIME-INR     Status: None   Collection Time    12/30/13  6:27 PM      Result Value Ref Range   Prothrombin Time 12.6  11.6 - 15.2 seconds   INR 0.96  0.00 - 1.49  CK     Status: None   Collection Time    12/30/13  6:27 PM      Result Value Ref Range   Total CK 162  7 - 177 U/L  D-DIMER, QUANTITATIVE     Status: None   Collection Time    12/30/13  6:27 PM      Result Value Ref Range   D-Dimer, Quant 0.33  0.00 - 0.48 ug/mL-FEU   Comment:            AT THE INHOUSE ESTABLISHED CUTOFF     VALUE OF 0.48 ug/mL FEU,     THIS ASSAY HAS BEEN DOCUMENTED     IN THE LITERATURE TO HAVE     A SENSITIVITY AND NEGATIVE     PREDICTIVE VALUE OF AT LEAST     98 TO 99%.  THE TEST RESULT     SHOULD BE CORRELATED WITH     AN ASSESSMENT OF THE CLINICAL     PROBABILITY OF DVT / VTE.  FIBRINOGEN     Status: None   Collection Time    12/30/13  6:27 PM      Result Value Ref Range   Fibrinogen 209  204 - 475 mg/dL  D-DIMER, QUANTITATIVE     Status: None   Collection Time    12/31/13  4:00 AM      Result Value Ref Range    D-Dimer, Quant 0.47  0.00 - 0.48 ug/mL-FEU   Comment:            AT THE INHOUSE ESTABLISHED CUTOFF     VALUE OF 0.48 ug/mL FEU,     THIS ASSAY HAS BEEN DOCUMENTED     IN THE LITERATURE TO HAVE     A SENSITIVITY AND NEGATIVE     PREDICTIVE VALUE OF AT LEAST     98 TO 99%.  THE TEST RESULT     SHOULD BE CORRELATED WITH     AN ASSESSMENT OF THE CLINICAL     PROBABILITY OF DVT / VTE.  PROTIME-INR     Status: None   Collection Time    12/31/13  4:00 AM      Result Value Ref Range   Prothrombin Time 13.2  11.6 - 15.2 seconds   INR 1.02  0.00 - 1.47  BASIC METABOLIC PANEL     Status: Abnormal   Collection Time    12/31/13  4:00 AM      Result Value Ref Range   Sodium 142  137 - 147 mEq/L   Potassium 3.6 (*) 3.7 - 5.3 mEq/L   Chloride 107  96 - 112 mEq/L   CO2 25  19 - 32 mEq/L   Glucose, Bld 85  70 - 99 mg/dL   BUN 17  6 - 23 mg/dL   Creatinine, Ser  0.82  0.50 - 1.10 mg/dL   Calcium 7.2 (*) 8.4 - 10.5 mg/dL   GFR calc non Af Amer 74 (*) >90 mL/min   GFR calc Af Amer 86 (*) >90 mL/min   Comment: (NOTE)     The eGFR has been calculated using the CKD EPI equation.     This calculation has not been validated in all clinical situations.     eGFR's persistently <90 mL/min signify possible Chronic Kidney     Disease.  CBC     Status: Abnormal   Collection Time    12/31/13  4:00 AM      Result Value Ref Range   WBC 11.2 (*) 4.0 - 10.5 K/uL   RBC 3.98  3.87 - 5.11 MIL/uL   Hemoglobin 11.7 (*) 12.0 - 15.0 g/dL   HCT 35.5 (*) 36.0 - 46.0 %   MCV 89.2  78.0 - 100.0 fL   MCH 29.4  26.0 - 34.0 pg   MCHC 33.0  30.0 - 36.0 g/dL   RDW 12.9  11.5 - 15.5 %   Platelets 212  150 - 400 K/uL  CBC     Status: None   Collection Time    12/31/13  7:10 AM      Result Value Ref Range   WBC 10.0  4.0 - 10.5 K/uL   RBC 4.10  3.87 - 5.11 MIL/uL   Hemoglobin 12.1  12.0 - 15.0 g/dL   HCT 36.5  36.0 - 46.0 %   MCV 89.0  78.0 - 100.0 fL   MCH 29.5  26.0 - 34.0 pg   MCHC 33.2  30.0 - 36.0 g/dL    RDW 13.0  11.5 - 15.5 %   Platelets 204  150 - 400 K/uL    No results found.  Review of Systems  All other systems reviewed and are negative.  Blood pressure 155/92, pulse 62, temperature 97.5 F (36.4 C), temperature source Oral, resp. rate 16, height 5' 1" (1.549 m), weight 57.834 kg (127 lb 8 oz), SpO2 98.00%. Physical Exam  Constitutional: She is oriented to person, place, and time. She appears well-developed and well-nourished.  HENT:  Head: Normocephalic and atraumatic.  Cardiovascular: Normal rate.   Respiratory: Effort normal.  Musculoskeletal:       Right hand: She exhibits decreased range of motion and swelling.  Right hand dorsal swelling s/p snakebite 5/17 with no signs of neurovascular compromise  Neurological: She is alert and oriented to person, place, and time.  Skin: Skin is warm. There is erythema.  Psychiatric: She has a normal mood and affect. Her behavior is normal. Judgment and thought content normal.    Assessment/Plan: As above  No surgical intervention needed at this point  Continue elevation and ABX  Will need to see in my office this week  Schuyler Amor 12/31/2013, 12:05 PM

## 2013-12-31 NOTE — Care Management Note (Unsigned)
    Page 1 of 1   12/31/2013     1:37:17 PM CARE MANAGEMENT NOTE 12/31/2013  Patient:  Alyssa James,Alyssa James   Account Number:  [id number]  Date Initiated:  12/31/2013  Documentation initiated by:  Terrionna Bridwell  Subjective/Objective Assessment:   Pt adm on 12/30/13 s/p snakebite.  PTA, pt independent of ADLs.     Action/Plan:   Will follow for dc needs as pt progresses.   Anticipated DC Date:  01/01/2014   Anticipated DC Plan:  HOME/SELF CARE      DC Planning Services  CM consult      Choice offered to / List presented to:             Status of service:  In process, will continue to follow Medicare Important Message given?   (If response is "NO", the following Medicare IM given date fields will be blank) Date Medicare IM given:   Date Additional Medicare IM given:    Discharge Disposition:    Per UR Regulation:  Reviewed for med. necessity/level of care/duration of stay  If discussed at Long Length of Stay Meetings, dates discussed:    Comments:

## 2013-12-31 NOTE — Progress Notes (Addendum)
Wound Measurements:   0115: Hand 20.0cm Wrist 18.0cm Forearm 23.8cm Bicep 23cm 0420: Hand 20.0cm Wrist 18.0cm Forearm 24.2cm Bicep 32.5cm 0615: Hand 21.0cm Wrist 16.5cm Forearm 25.0cm Bicep 30.8cm

## 2014-01-01 LAB — BASIC METABOLIC PANEL
BUN: 10 mg/dL (ref 6–23)
CHLORIDE: 106 meq/L (ref 96–112)
CO2: 27 mEq/L (ref 19–32)
CREATININE: 0.77 mg/dL (ref 0.50–1.10)
Calcium: 8.6 mg/dL (ref 8.4–10.5)
GFR calc Af Amer: >90 mL/min (ref >90)
GFR calc non Af Amer: 87 mL/min — ABNORMAL LOW (ref >90)
Glucose, Bld: 84 mg/dL (ref 70–99)
Potassium: 4.6 mEq/L (ref 3.7–5.3)
Sodium: 141 mEq/L (ref 137–147)

## 2014-01-01 LAB — CBC
HEMATOCRIT: 41.3 % (ref 36.0–46.0)
Hemoglobin: 13.7 g/dL (ref 12.0–15.0)
MCH: 29.8 pg (ref 26.0–34.0)
MCHC: 33.2 g/dL (ref 30.0–36.0)
MCV: 89.8 fL (ref 78.0–100.0)
Platelets: 211 10*3/uL (ref 150–400)
RBC: 4.6 MIL/uL (ref 3.87–5.11)
RDW: 12.9 % (ref 11.5–15.5)
WBC: 9.1 10*3/uL (ref 4.0–10.5)

## 2014-01-01 MED ORDER — HYDROCODONE-ACETAMINOPHEN 5-325 MG PO TABS: 1.0000 | ORAL_TABLET | Freq: Four times a day (QID) | ORAL | Status: DC | PRN

## 2014-01-01 NOTE — Progress Notes (Signed)
DC IV and tele per MD orders and protocol; DC instructions reviewed and signed by pt; paper prescription given to pt; no further questions; will follow up with poison control with any questions regarding bite, PCP and orthopedics.  Park BreedHeather M Barnett, RN

## 2014-01-01 NOTE — Discharge Summary (Signed)
Physician Discharge Summary  Patient ID: Alyssa James MRN: [id number] DOB/AGE: 23-Aug-1950 63 y.o.  Admit date: 12/30/2013 Discharge date: 01/01/2014  Admission Diagnoses:  Discharge Diagnoses:  Principal Problem:   Snake bite- Right arm swelling Active Problems:   Nausea and vomiting   Hypokalemia   Hyperlipidemia   Discharged Condition: good  Hospital Course: 63 years old female female admitted after snakbite- , [place] head- right hand swelling pain. Problem #1: snakebite   To  right hand- in the emergency room patient received one dose of  CroFab- antivenom; as per the poison control. X-ray negative;  Recommended inpatient observation elevation of the right hand and arm; neurovascular intact; no other symptoms of snakebite. Blood work unremrkable- poison control followed for next 24 hours Hand orthopedic consult- no further  recommendation up outpatientPatient had nausea and vomitng on admission resolved wih fluids and Zofran. hypokinemia: reslved with potassium replacement Patient has back pain-was taking prednisone at home-discntinued. Vicodin p..r.n. For pain.  Consults: orthopedic surgery  Significant Diagnostic Studies: labs: blood work unremarkable; and radiology: X-Ray: hand negative  Treatments: IV hydration  Discharge Exam: Blood pressure 137/79, pulse 64, temperature 98.1 F (36.7 C), temperature source Oral, resp. rate 18, height 5\' 1"  (1.549 m), weight 57.3 kg (126 lb 5.2 oz), SpO2 99.00%. General appearance: alert Cardio: regular rate and rhythm Extremities: edema of the right hand  Disposition: Final discharge disposition not confirmed  Discharge Instructions   Diet - low sodium heart healthy    Complete by:  As directed      Discharge instructions    Complete by:  As directed   Elevate Right arm F/u with Hand Ortho- Dr Mina MarbleWeingold     Increase activity slowly    Complete by:  As directed             Medication List    STOP taking these medications       predniSONE 10 MG tablet  Commonly known as:  DELTASONE      TAKE these medications       albuterol 108 (90 BASE) MCG/ACT inhaler  Commonly known as:  PROVENTIL HFA;VENTOLIN HFA  Inhale 2 puffs into the lungs every 6 (six) hours as needed for wheezing or shortness of breath.     aspirin 81 MG chewable tablet  Chew by mouth daily.     HYDROcodone-acetaminophen 5-325 MG per tablet  Commonly known as:  NORCO/VICODIN  Take 1 tablet by mouth every 6 (six) hours as needed for moderate pain.     ibuprofen 800 MG tablet  Commonly known as:  ADVIL,MOTRIN  Take 800 mg by mouth every 8 (eight) hours as needed for headache.     OVER THE COUNTER MEDICATION  Take 1 tablet by mouth daily. Calcium gummies     OVER THE COUNTER MEDICATION  Take 1 tablet by mouth daily. Fish oil gummies     VITAMIN D3 ADULT GUMMIES PO  Take 1 tablet by mouth daily.           Follow-up Information   Follow up In 1 week.      Follow up with Georgann HousekeeperHUSAIN,Arvle Grabe, MD In 1 week.   Specialty:  Internal Medicine   Contact information:   [address], [address] [place] [address] [phone]      Discharge planning 40 min total  Signed: Georgann HousekeeperKarrar Ardell Aaronson 01/01/2014, 7:56 AM

## 2014-08-13 DIAGNOSIS — J40 Bronchitis, not specified as acute or chronic: Secondary | ICD-10-CM | POA: Diagnosis not present

## 2014-09-27 DIAGNOSIS — J069 Acute upper respiratory infection, unspecified: Secondary | ICD-10-CM | POA: Diagnosis not present

## 2014-10-31 DIAGNOSIS — Z0000 Encounter for general adult medical examination without abnormal findings: Secondary | ICD-10-CM | POA: Diagnosis not present

## 2014-10-31 DIAGNOSIS — J309 Allergic rhinitis, unspecified: Secondary | ICD-10-CM | POA: Diagnosis not present

## 2014-10-31 DIAGNOSIS — K219 Gastro-esophageal reflux disease without esophagitis: Secondary | ICD-10-CM | POA: Diagnosis not present

## 2014-10-31 DIAGNOSIS — E782 Mixed hyperlipidemia: Secondary | ICD-10-CM | POA: Diagnosis not present

## 2014-10-31 DIAGNOSIS — Z1389 Encounter for screening for other disorder: Secondary | ICD-10-CM | POA: Diagnosis not present

## 2014-10-31 DIAGNOSIS — M8580 Other specified disorders of bone density and structure, unspecified site: Secondary | ICD-10-CM | POA: Diagnosis not present

## 2014-10-31 DIAGNOSIS — Z23 Encounter for immunization: Secondary | ICD-10-CM | POA: Diagnosis not present

## 2014-12-12 DIAGNOSIS — N905 Atrophy of vulva: Secondary | ICD-10-CM | POA: Diagnosis not present

## 2015-02-11 DIAGNOSIS — Z1231 Encounter for screening mammogram for malignant neoplasm of breast: Secondary | ICD-10-CM | POA: Diagnosis not present

## 2015-08-17 HISTORY — PX: SHOULDER ARTHROSCOPY: SHX128

## 2015-08-29 DIAGNOSIS — M1990 Unspecified osteoarthritis, unspecified site: Secondary | ICD-10-CM | POA: Diagnosis not present

## 2015-08-29 DIAGNOSIS — M25511 Pain in right shoulder: Secondary | ICD-10-CM | POA: Diagnosis not present

## 2015-09-10 DIAGNOSIS — M67911 Unspecified disorder of synovium and tendon, right shoulder: Secondary | ICD-10-CM | POA: Diagnosis not present

## 2015-09-10 DIAGNOSIS — M75101 Unspecified rotator cuff tear or rupture of right shoulder, not specified as traumatic: Secondary | ICD-10-CM | POA: Diagnosis not present

## 2015-10-01 DIAGNOSIS — M67911 Unspecified disorder of synovium and tendon, right shoulder: Secondary | ICD-10-CM | POA: Diagnosis not present

## 2015-10-03 DIAGNOSIS — J069 Acute upper respiratory infection, unspecified: Secondary | ICD-10-CM | POA: Diagnosis not present

## 2015-10-09 DIAGNOSIS — M25511 Pain in right shoulder: Secondary | ICD-10-CM | POA: Diagnosis not present

## 2015-10-15 DIAGNOSIS — M75111 Incomplete rotator cuff tear or rupture of right shoulder, not specified as traumatic: Secondary | ICD-10-CM | POA: Diagnosis not present

## 2015-10-28 DIAGNOSIS — M75121 Complete rotator cuff tear or rupture of right shoulder, not specified as traumatic: Secondary | ICD-10-CM | POA: Diagnosis not present

## 2015-10-28 DIAGNOSIS — M7541 Impingement syndrome of right shoulder: Secondary | ICD-10-CM | POA: Diagnosis not present

## 2015-10-28 DIAGNOSIS — M66321 Spontaneous rupture of flexor tendons, right upper arm: Secondary | ICD-10-CM | POA: Diagnosis not present

## 2015-10-28 DIAGNOSIS — M66811 Spontaneous rupture of other tendons, right shoulder: Secondary | ICD-10-CM | POA: Diagnosis not present

## 2015-10-28 DIAGNOSIS — G8918 Other acute postprocedural pain: Secondary | ICD-10-CM | POA: Diagnosis not present

## 2015-11-05 DIAGNOSIS — F419 Anxiety disorder, unspecified: Secondary | ICD-10-CM | POA: Diagnosis not present

## 2015-11-05 DIAGNOSIS — E782 Mixed hyperlipidemia: Secondary | ICD-10-CM | POA: Diagnosis not present

## 2015-11-05 DIAGNOSIS — E559 Vitamin D deficiency, unspecified: Secondary | ICD-10-CM | POA: Diagnosis not present

## 2015-11-05 DIAGNOSIS — Z0000 Encounter for general adult medical examination without abnormal findings: Secondary | ICD-10-CM | POA: Diagnosis not present

## 2015-11-05 DIAGNOSIS — M25611 Stiffness of right shoulder, not elsewhere classified: Secondary | ICD-10-CM | POA: Diagnosis not present

## 2015-11-05 DIAGNOSIS — Z1389 Encounter for screening for other disorder: Secondary | ICD-10-CM | POA: Diagnosis not present

## 2015-11-05 DIAGNOSIS — M6281 Muscle weakness (generalized): Secondary | ICD-10-CM | POA: Diagnosis not present

## 2015-11-05 DIAGNOSIS — R21 Rash and other nonspecific skin eruption: Secondary | ICD-10-CM | POA: Diagnosis not present

## 2015-11-05 DIAGNOSIS — M25511 Pain in right shoulder: Secondary | ICD-10-CM | POA: Diagnosis not present

## 2015-11-05 DIAGNOSIS — Z9889 Other specified postprocedural states: Secondary | ICD-10-CM | POA: Diagnosis not present

## 2015-11-05 DIAGNOSIS — M1990 Unspecified osteoarthritis, unspecified site: Secondary | ICD-10-CM | POA: Diagnosis not present

## 2015-11-05 DIAGNOSIS — G43909 Migraine, unspecified, not intractable, without status migrainosus: Secondary | ICD-10-CM | POA: Diagnosis not present

## 2015-11-05 DIAGNOSIS — I7300 Raynaud's syndrome without gangrene: Secondary | ICD-10-CM | POA: Diagnosis not present

## 2015-11-05 DIAGNOSIS — M8580 Other specified disorders of bone density and structure, unspecified site: Secondary | ICD-10-CM | POA: Diagnosis not present

## 2015-11-18 DIAGNOSIS — M6281 Muscle weakness (generalized): Secondary | ICD-10-CM | POA: Diagnosis not present

## 2015-11-18 DIAGNOSIS — M25511 Pain in right shoulder: Secondary | ICD-10-CM | POA: Diagnosis not present

## 2015-11-18 DIAGNOSIS — M25611 Stiffness of right shoulder, not elsewhere classified: Secondary | ICD-10-CM | POA: Diagnosis not present

## 2015-11-25 DIAGNOSIS — M25611 Stiffness of right shoulder, not elsewhere classified: Secondary | ICD-10-CM | POA: Diagnosis not present

## 2015-11-25 DIAGNOSIS — M6281 Muscle weakness (generalized): Secondary | ICD-10-CM | POA: Diagnosis not present

## 2015-11-25 DIAGNOSIS — M25511 Pain in right shoulder: Secondary | ICD-10-CM | POA: Diagnosis not present

## 2015-11-27 DIAGNOSIS — M25511 Pain in right shoulder: Secondary | ICD-10-CM | POA: Diagnosis not present

## 2015-11-27 DIAGNOSIS — M6281 Muscle weakness (generalized): Secondary | ICD-10-CM | POA: Diagnosis not present

## 2015-11-27 DIAGNOSIS — M25611 Stiffness of right shoulder, not elsewhere classified: Secondary | ICD-10-CM | POA: Diagnosis not present

## 2015-12-01 DIAGNOSIS — Z9889 Other specified postprocedural states: Secondary | ICD-10-CM | POA: Diagnosis not present

## 2015-12-02 DIAGNOSIS — M25511 Pain in right shoulder: Secondary | ICD-10-CM | POA: Diagnosis not present

## 2015-12-02 DIAGNOSIS — M6281 Muscle weakness (generalized): Secondary | ICD-10-CM | POA: Diagnosis not present

## 2015-12-02 DIAGNOSIS — M25611 Stiffness of right shoulder, not elsewhere classified: Secondary | ICD-10-CM | POA: Diagnosis not present

## 2015-12-04 DIAGNOSIS — M25611 Stiffness of right shoulder, not elsewhere classified: Secondary | ICD-10-CM | POA: Diagnosis not present

## 2015-12-04 DIAGNOSIS — M25511 Pain in right shoulder: Secondary | ICD-10-CM | POA: Diagnosis not present

## 2015-12-04 DIAGNOSIS — M6281 Muscle weakness (generalized): Secondary | ICD-10-CM | POA: Diagnosis not present

## 2015-12-09 DIAGNOSIS — M25511 Pain in right shoulder: Secondary | ICD-10-CM | POA: Diagnosis not present

## 2015-12-09 DIAGNOSIS — M25611 Stiffness of right shoulder, not elsewhere classified: Secondary | ICD-10-CM | POA: Diagnosis not present

## 2015-12-09 DIAGNOSIS — M6281 Muscle weakness (generalized): Secondary | ICD-10-CM | POA: Diagnosis not present

## 2015-12-11 DIAGNOSIS — M25611 Stiffness of right shoulder, not elsewhere classified: Secondary | ICD-10-CM | POA: Diagnosis not present

## 2015-12-11 DIAGNOSIS — M25511 Pain in right shoulder: Secondary | ICD-10-CM | POA: Diagnosis not present

## 2015-12-11 DIAGNOSIS — M6281 Muscle weakness (generalized): Secondary | ICD-10-CM | POA: Diagnosis not present

## 2015-12-16 DIAGNOSIS — M25611 Stiffness of right shoulder, not elsewhere classified: Secondary | ICD-10-CM | POA: Diagnosis not present

## 2015-12-16 DIAGNOSIS — M6281 Muscle weakness (generalized): Secondary | ICD-10-CM | POA: Diagnosis not present

## 2015-12-16 DIAGNOSIS — M25511 Pain in right shoulder: Secondary | ICD-10-CM | POA: Diagnosis not present

## 2015-12-18 DIAGNOSIS — M6281 Muscle weakness (generalized): Secondary | ICD-10-CM | POA: Diagnosis not present

## 2015-12-18 DIAGNOSIS — M25511 Pain in right shoulder: Secondary | ICD-10-CM | POA: Diagnosis not present

## 2015-12-18 DIAGNOSIS — M25611 Stiffness of right shoulder, not elsewhere classified: Secondary | ICD-10-CM | POA: Diagnosis not present

## 2015-12-23 DIAGNOSIS — M6281 Muscle weakness (generalized): Secondary | ICD-10-CM | POA: Diagnosis not present

## 2015-12-23 DIAGNOSIS — M25611 Stiffness of right shoulder, not elsewhere classified: Secondary | ICD-10-CM | POA: Diagnosis not present

## 2015-12-23 DIAGNOSIS — M25511 Pain in right shoulder: Secondary | ICD-10-CM | POA: Diagnosis not present

## 2015-12-25 DIAGNOSIS — M25511 Pain in right shoulder: Secondary | ICD-10-CM | POA: Diagnosis not present

## 2015-12-25 DIAGNOSIS — M6281 Muscle weakness (generalized): Secondary | ICD-10-CM | POA: Diagnosis not present

## 2015-12-25 DIAGNOSIS — M25611 Stiffness of right shoulder, not elsewhere classified: Secondary | ICD-10-CM | POA: Diagnosis not present

## 2015-12-30 DIAGNOSIS — M25611 Stiffness of right shoulder, not elsewhere classified: Secondary | ICD-10-CM | POA: Diagnosis not present

## 2015-12-30 DIAGNOSIS — M25511 Pain in right shoulder: Secondary | ICD-10-CM | POA: Diagnosis not present

## 2015-12-30 DIAGNOSIS — M6281 Muscle weakness (generalized): Secondary | ICD-10-CM | POA: Diagnosis not present

## 2016-01-01 DIAGNOSIS — M6281 Muscle weakness (generalized): Secondary | ICD-10-CM | POA: Diagnosis not present

## 2016-01-01 DIAGNOSIS — M25511 Pain in right shoulder: Secondary | ICD-10-CM | POA: Diagnosis not present

## 2016-01-01 DIAGNOSIS — M25611 Stiffness of right shoulder, not elsewhere classified: Secondary | ICD-10-CM | POA: Diagnosis not present

## 2016-01-05 DIAGNOSIS — Z9889 Other specified postprocedural states: Secondary | ICD-10-CM | POA: Diagnosis not present

## 2016-01-06 DIAGNOSIS — M25611 Stiffness of right shoulder, not elsewhere classified: Secondary | ICD-10-CM | POA: Diagnosis not present

## 2016-01-06 DIAGNOSIS — M6281 Muscle weakness (generalized): Secondary | ICD-10-CM | POA: Diagnosis not present

## 2016-01-06 DIAGNOSIS — M25511 Pain in right shoulder: Secondary | ICD-10-CM | POA: Diagnosis not present

## 2016-01-08 DIAGNOSIS — M6281 Muscle weakness (generalized): Secondary | ICD-10-CM | POA: Diagnosis not present

## 2016-01-08 DIAGNOSIS — M25511 Pain in right shoulder: Secondary | ICD-10-CM | POA: Diagnosis not present

## 2016-01-08 DIAGNOSIS — M25611 Stiffness of right shoulder, not elsewhere classified: Secondary | ICD-10-CM | POA: Diagnosis not present

## 2016-01-13 DIAGNOSIS — M25611 Stiffness of right shoulder, not elsewhere classified: Secondary | ICD-10-CM | POA: Diagnosis not present

## 2016-01-13 DIAGNOSIS — M25511 Pain in right shoulder: Secondary | ICD-10-CM | POA: Diagnosis not present

## 2016-01-13 DIAGNOSIS — M6281 Muscle weakness (generalized): Secondary | ICD-10-CM | POA: Diagnosis not present

## 2016-01-15 DIAGNOSIS — M25611 Stiffness of right shoulder, not elsewhere classified: Secondary | ICD-10-CM | POA: Diagnosis not present

## 2016-01-15 DIAGNOSIS — M6281 Muscle weakness (generalized): Secondary | ICD-10-CM | POA: Diagnosis not present

## 2016-01-15 DIAGNOSIS — M25511 Pain in right shoulder: Secondary | ICD-10-CM | POA: Diagnosis not present

## 2016-01-20 DIAGNOSIS — M25511 Pain in right shoulder: Secondary | ICD-10-CM | POA: Diagnosis not present

## 2016-01-20 DIAGNOSIS — M6281 Muscle weakness (generalized): Secondary | ICD-10-CM | POA: Diagnosis not present

## 2016-01-20 DIAGNOSIS — M25611 Stiffness of right shoulder, not elsewhere classified: Secondary | ICD-10-CM | POA: Diagnosis not present

## 2016-01-22 DIAGNOSIS — M25511 Pain in right shoulder: Secondary | ICD-10-CM | POA: Diagnosis not present

## 2016-01-22 DIAGNOSIS — M25611 Stiffness of right shoulder, not elsewhere classified: Secondary | ICD-10-CM | POA: Diagnosis not present

## 2016-01-22 DIAGNOSIS — M6281 Muscle weakness (generalized): Secondary | ICD-10-CM | POA: Diagnosis not present

## 2016-01-27 DIAGNOSIS — M6281 Muscle weakness (generalized): Secondary | ICD-10-CM | POA: Diagnosis not present

## 2016-01-27 DIAGNOSIS — M25511 Pain in right shoulder: Secondary | ICD-10-CM | POA: Diagnosis not present

## 2016-01-27 DIAGNOSIS — M25611 Stiffness of right shoulder, not elsewhere classified: Secondary | ICD-10-CM | POA: Diagnosis not present

## 2016-01-29 DIAGNOSIS — M25511 Pain in right shoulder: Secondary | ICD-10-CM | POA: Diagnosis not present

## 2016-01-29 DIAGNOSIS — M6281 Muscle weakness (generalized): Secondary | ICD-10-CM | POA: Diagnosis not present

## 2016-01-29 DIAGNOSIS — M25611 Stiffness of right shoulder, not elsewhere classified: Secondary | ICD-10-CM | POA: Diagnosis not present

## 2016-02-03 DIAGNOSIS — M25511 Pain in right shoulder: Secondary | ICD-10-CM | POA: Diagnosis not present

## 2016-02-03 DIAGNOSIS — M25611 Stiffness of right shoulder, not elsewhere classified: Secondary | ICD-10-CM | POA: Diagnosis not present

## 2016-02-03 DIAGNOSIS — M6281 Muscle weakness (generalized): Secondary | ICD-10-CM | POA: Diagnosis not present

## 2016-02-05 DIAGNOSIS — M25611 Stiffness of right shoulder, not elsewhere classified: Secondary | ICD-10-CM | POA: Diagnosis not present

## 2016-02-05 DIAGNOSIS — M85852 Other specified disorders of bone density and structure, left thigh: Secondary | ICD-10-CM | POA: Diagnosis not present

## 2016-02-05 DIAGNOSIS — M6281 Muscle weakness (generalized): Secondary | ICD-10-CM | POA: Diagnosis not present

## 2016-02-05 DIAGNOSIS — M25511 Pain in right shoulder: Secondary | ICD-10-CM | POA: Diagnosis not present

## 2016-02-05 DIAGNOSIS — M810 Age-related osteoporosis without current pathological fracture: Secondary | ICD-10-CM | POA: Diagnosis not present

## 2016-02-11 DIAGNOSIS — M6281 Muscle weakness (generalized): Secondary | ICD-10-CM | POA: Diagnosis not present

## 2016-02-11 DIAGNOSIS — M25511 Pain in right shoulder: Secondary | ICD-10-CM | POA: Diagnosis not present

## 2016-02-11 DIAGNOSIS — M25611 Stiffness of right shoulder, not elsewhere classified: Secondary | ICD-10-CM | POA: Diagnosis not present

## 2016-02-18 DIAGNOSIS — Z01419 Encounter for gynecological examination (general) (routine) without abnormal findings: Secondary | ICD-10-CM | POA: Diagnosis not present

## 2016-02-18 DIAGNOSIS — Z1231 Encounter for screening mammogram for malignant neoplasm of breast: Secondary | ICD-10-CM | POA: Diagnosis not present

## 2016-02-19 DIAGNOSIS — M25611 Stiffness of right shoulder, not elsewhere classified: Secondary | ICD-10-CM | POA: Diagnosis not present

## 2016-02-19 DIAGNOSIS — M25511 Pain in right shoulder: Secondary | ICD-10-CM | POA: Diagnosis not present

## 2016-02-19 DIAGNOSIS — M6281 Muscle weakness (generalized): Secondary | ICD-10-CM | POA: Diagnosis not present

## 2016-02-20 DIAGNOSIS — Z09 Encounter for follow-up examination after completed treatment for conditions other than malignant neoplasm: Secondary | ICD-10-CM | POA: Diagnosis not present

## 2016-02-26 DIAGNOSIS — M25611 Stiffness of right shoulder, not elsewhere classified: Secondary | ICD-10-CM | POA: Diagnosis not present

## 2016-02-26 DIAGNOSIS — M25511 Pain in right shoulder: Secondary | ICD-10-CM | POA: Diagnosis not present

## 2016-02-26 DIAGNOSIS — M6281 Muscle weakness (generalized): Secondary | ICD-10-CM | POA: Diagnosis not present

## 2016-03-03 DIAGNOSIS — M25611 Stiffness of right shoulder, not elsewhere classified: Secondary | ICD-10-CM | POA: Diagnosis not present

## 2016-03-03 DIAGNOSIS — M25511 Pain in right shoulder: Secondary | ICD-10-CM | POA: Diagnosis not present

## 2016-03-03 DIAGNOSIS — M6281 Muscle weakness (generalized): Secondary | ICD-10-CM | POA: Diagnosis not present

## 2016-04-21 DIAGNOSIS — Z09 Encounter for follow-up examination after completed treatment for conditions other than malignant neoplasm: Secondary | ICD-10-CM | POA: Diagnosis not present

## 2016-04-28 DIAGNOSIS — H52223 Regular astigmatism, bilateral: Secondary | ICD-10-CM | POA: Diagnosis not present

## 2016-04-28 DIAGNOSIS — H5210 Myopia, unspecified eye: Secondary | ICD-10-CM | POA: Diagnosis not present

## 2016-06-02 DIAGNOSIS — Z23 Encounter for immunization: Secondary | ICD-10-CM | POA: Diagnosis not present

## 2016-08-31 DIAGNOSIS — J209 Acute bronchitis, unspecified: Secondary | ICD-10-CM | POA: Diagnosis not present

## 2016-08-31 DIAGNOSIS — J45909 Unspecified asthma, uncomplicated: Secondary | ICD-10-CM | POA: Diagnosis not present

## 2016-11-09 DIAGNOSIS — Z1211 Encounter for screening for malignant neoplasm of colon: Secondary | ICD-10-CM | POA: Diagnosis not present

## 2016-11-09 DIAGNOSIS — R21 Rash and other nonspecific skin eruption: Secondary | ICD-10-CM | POA: Diagnosis not present

## 2016-11-09 DIAGNOSIS — M1990 Unspecified osteoarthritis, unspecified site: Secondary | ICD-10-CM | POA: Diagnosis not present

## 2016-11-09 DIAGNOSIS — I7300 Raynaud's syndrome without gangrene: Secondary | ICD-10-CM | POA: Diagnosis not present

## 2016-11-09 DIAGNOSIS — M8580 Other specified disorders of bone density and structure, unspecified site: Secondary | ICD-10-CM | POA: Diagnosis not present

## 2016-11-09 DIAGNOSIS — K219 Gastro-esophageal reflux disease without esophagitis: Secondary | ICD-10-CM | POA: Diagnosis not present

## 2016-11-09 DIAGNOSIS — Z0000 Encounter for general adult medical examination without abnormal findings: Secondary | ICD-10-CM | POA: Diagnosis not present

## 2016-11-09 DIAGNOSIS — J309 Allergic rhinitis, unspecified: Secondary | ICD-10-CM | POA: Diagnosis not present

## 2016-11-09 DIAGNOSIS — E782 Mixed hyperlipidemia: Secondary | ICD-10-CM | POA: Diagnosis not present

## 2016-11-09 DIAGNOSIS — G43909 Migraine, unspecified, not intractable, without status migrainosus: Secondary | ICD-10-CM | POA: Diagnosis not present

## 2016-11-09 DIAGNOSIS — Z1389 Encounter for screening for other disorder: Secondary | ICD-10-CM | POA: Diagnosis not present

## 2016-11-09 DIAGNOSIS — E559 Vitamin D deficiency, unspecified: Secondary | ICD-10-CM | POA: Diagnosis not present

## 2016-12-08 DIAGNOSIS — J029 Acute pharyngitis, unspecified: Secondary | ICD-10-CM | POA: Diagnosis not present

## 2016-12-13 DIAGNOSIS — H43812 Vitreous degeneration, left eye: Secondary | ICD-10-CM | POA: Diagnosis not present

## 2016-12-28 DIAGNOSIS — K5730 Diverticulosis of large intestine without perforation or abscess without bleeding: Secondary | ICD-10-CM | POA: Diagnosis not present

## 2016-12-28 DIAGNOSIS — Z1211 Encounter for screening for malignant neoplasm of colon: Secondary | ICD-10-CM | POA: Diagnosis not present

## 2016-12-28 DIAGNOSIS — K640 First degree hemorrhoids: Secondary | ICD-10-CM | POA: Diagnosis not present

## 2017-02-15 DIAGNOSIS — R51 Headache: Secondary | ICD-10-CM | POA: Diagnosis not present

## 2017-03-07 DIAGNOSIS — Z01419 Encounter for gynecological examination (general) (routine) without abnormal findings: Secondary | ICD-10-CM | POA: Diagnosis not present

## 2017-03-07 DIAGNOSIS — Z1231 Encounter for screening mammogram for malignant neoplasm of breast: Secondary | ICD-10-CM | POA: Diagnosis not present

## 2017-06-08 DIAGNOSIS — Z23 Encounter for immunization: Secondary | ICD-10-CM | POA: Diagnosis not present

## 2017-07-05 DIAGNOSIS — H40003 Preglaucoma, unspecified, bilateral: Secondary | ICD-10-CM | POA: Diagnosis not present

## 2017-07-05 DIAGNOSIS — H52223 Regular astigmatism, bilateral: Secondary | ICD-10-CM | POA: Diagnosis not present

## 2017-07-14 ENCOUNTER — Other Ambulatory Visit (HOSPITAL_COMMUNITY): Payer: Self-pay | Admitting: Internal Medicine

## 2017-07-14 ENCOUNTER — Other Ambulatory Visit: Payer: Self-pay | Admitting: Internal Medicine

## 2017-07-14 ENCOUNTER — Ambulatory Visit (HOSPITAL_COMMUNITY)
Admission: RE | Admit: 2017-07-14 | Discharge: 2017-07-14 | Disposition: A | Discharge status: Home / Self Care | Payer: Medicare HMO | Source: Ambulatory Visit | Attending: Pediatrics | Admitting: Pediatrics

## 2017-07-14 DIAGNOSIS — M79661 Pain in right lower leg: Secondary | ICD-10-CM

## 2017-07-14 DIAGNOSIS — M7989 Other specified soft tissue disorders: Principal | ICD-10-CM

## 2017-07-14 DIAGNOSIS — M25561 Pain in right knee: Secondary | ICD-10-CM | POA: Diagnosis not present

## 2017-07-14 DIAGNOSIS — M79604 Pain in right leg: Secondary | ICD-10-CM

## 2017-07-14 DIAGNOSIS — R600 Localized edema: Secondary | ICD-10-CM | POA: Diagnosis not present

## 2017-07-14 DIAGNOSIS — T1490XA Injury, unspecified, initial encounter: Secondary | ICD-10-CM | POA: Diagnosis not present

## 2017-07-14 NOTE — Progress Notes (Signed)
RLE venous duplex prelim: negative for DVT. Farrel DemarkJill Eunice, RDMS, RVT  Called results to Dr. Eden LatheShanoff.

## 2017-07-15 ENCOUNTER — Other Ambulatory Visit: Payer: Self-pay

## 2017-07-20 DIAGNOSIS — M25561 Pain in right knee: Secondary | ICD-10-CM | POA: Diagnosis not present

## 2017-07-23 ENCOUNTER — Encounter (HOSPITAL_COMMUNITY): Payer: Self-pay

## 2017-07-23 ENCOUNTER — Emergency Department (HOSPITAL_COMMUNITY)
Admission: EM | Admit: 2017-07-23 | Discharge: 2017-07-23 | Disposition: A | Discharge status: Home / Self Care | Payer: Medicare HMO | Attending: Emergency Medicine | Admitting: Emergency Medicine

## 2017-07-23 ENCOUNTER — Emergency Department (HOSPITAL_COMMUNITY): Payer: Medicare HMO

## 2017-07-23 ENCOUNTER — Other Ambulatory Visit: Payer: Self-pay

## 2017-07-23 DIAGNOSIS — Z79899 Other long term (current) drug therapy: Secondary | ICD-10-CM | POA: Diagnosis not present

## 2017-07-23 DIAGNOSIS — R109 Unspecified abdominal pain: Secondary | ICD-10-CM | POA: Diagnosis not present

## 2017-07-23 DIAGNOSIS — N201 Calculus of ureter: Secondary | ICD-10-CM | POA: Diagnosis not present

## 2017-07-23 DIAGNOSIS — N200 Calculus of kidney: Secondary | ICD-10-CM

## 2017-07-23 DIAGNOSIS — Z7982 Long term (current) use of aspirin: Secondary | ICD-10-CM | POA: Insufficient documentation

## 2017-07-23 DIAGNOSIS — R1032 Left lower quadrant pain: Secondary | ICD-10-CM | POA: Diagnosis present

## 2017-07-23 LAB — URINALYSIS, ROUTINE W REFLEX MICROSCOPIC
BACTERIA UA: NONE SEEN
Bilirubin Urine: NEGATIVE
Glucose, UA: NEGATIVE mg/dL
Ketones, ur: NEGATIVE mg/dL
Leukocytes, UA: NEGATIVE
Nitrite: NEGATIVE
PROTEIN: NEGATIVE mg/dL
Specific Gravity, Urine: 1.017 (ref 1.005–1.030)
Squamous Epithelial / LPF: NONE SEEN
WBC, UA: NONE SEEN WBC/hpf (ref 0–5)
pH: 6 (ref 5.0–8.0)

## 2017-07-23 LAB — BASIC METABOLIC PANEL
ANION GAP: 8 (ref 5–15)
BUN: 21 mg/dL — ABNORMAL HIGH (ref 6–20)
CHLORIDE: 106 mmol/L (ref 101–111)
CO2: 24 mmol/L (ref 22–32)
CREATININE: 1.08 mg/dL — AB (ref 0.44–1.00)
Calcium: 9.2 mg/dL (ref 8.9–10.3)
GFR calc Af Amer: 60 mL/min — ABNORMAL LOW (ref >60)
GFR calc non Af Amer: 52 mL/min — ABNORMAL LOW (ref >60)
Glucose, Bld: 103 mg/dL — ABNORMAL HIGH (ref 65–99)
POTASSIUM: 3.7 mmol/L (ref 3.5–5.1)
SODIUM: 138 mmol/L (ref 135–145)

## 2017-07-23 LAB — CBC
HEMATOCRIT: 40.6 % (ref 36.0–46.0)
HEMOGLOBIN: 13.7 g/dL (ref 12.0–15.0)
MCH: 30 pg (ref 26.0–34.0)
MCHC: 33.7 g/dL (ref 30.0–36.0)
MCV: 88.8 fL (ref 78.0–100.0)
Platelets: 266 10*3/uL (ref 150–400)
RBC: 4.57 MIL/uL (ref 3.87–5.11)
RDW: 12.5 % (ref 11.5–15.5)
WBC: 12.8 10*3/uL — ABNORMAL HIGH (ref 4.0–10.5)

## 2017-07-23 MED ORDER — HYDROCODONE-ACETAMINOPHEN 5-325 MG PO TABS: 1.0000 | ORAL_TABLET | ORAL | 0 refills | Status: DC | PRN

## 2017-07-23 MED ORDER — TAMSULOSIN HCL 0.4 MG PO CAPS: 0.4000 mg | ORAL_CAPSULE | Freq: Every day | ORAL | 0 refills | Status: DC

## 2017-07-23 MED ORDER — ONDANSETRON 8 MG PO TBDP: 8.0000 mg | ORAL_TABLET | Freq: Three times a day (TID) | ORAL | 0 refills | Status: DC | PRN

## 2017-07-23 NOTE — ED Notes (Signed)
Patient given discharge instructions and verbalized understanding.  Patient stable to discharge at this time.  Patient is alert and oriented to baseline.  No distressed noted at this time.  All belongings taken with the patient at discharge.   

## 2017-07-23 NOTE — Discharge Instructions (Signed)
You have a 4 mm kidney stone on the left side.

## 2017-07-23 NOTE — ED Provider Notes (Signed)
[facility] Provider Note   CSN: [id number] Arrival date & time: 07/23/17  1712     History   Chief Complaint Chief Complaint  Patient presents with  . Flank Pain    HPI Alyssa James is a 66 y.o. female.  66 year old female complains of acute onset of left-sided colicky flank pain since yesterday.  Remote history of kidney stones.  Has noted some hematuria.  Denies any dysuria.  No fever or chills.  No current vomiting or diarrhea.  Went to an urgent care center today and had a urine which was positive for blood.  Was sent here for further evaluation.  No treatment used prior to arrival.  Nothing makes her symptoms better      Past Medical History:  Diagnosis Date  . Anemia   . Complication of anesthesia    " TAKES ME AWHILE TO WAKE UP"  . GERD (gastroesophageal reflux disease)   . Hyperlipidemia   . Snake bite 12/30/2013    Patient Active Problem List   Diagnosis Date Noted  . Snake bite 12/30/2013  . Nausea and vomiting 12/30/2013  . Hypokalemia 12/30/2013  . Hyperlipidemia     Past Surgical History:  Procedure Laterality Date  . ABDOMINAL HYSTERECTOMY    . KNEE ARTHROSCOPY      OB History    No data available       Home Medications    Prior to Admission medications   Medication Sig Start Date End Date Taking? Authorizing Provider  albuterol (PROVENTIL HFA;VENTOLIN HFA) 108 (90 BASE) MCG/ACT inhaler Inhale 2 puffs into the lungs every 6 (six) hours as needed for wheezing or shortness of breath.    [provider]  aspirin 81 MG chewable tablet Chew by mouth daily.    [provider]  Cholecalciferol (VITAMIN D3 ADULT GUMMIES PO) Take 1 tablet by mouth daily.    [provider]  HYDROcodone-acetaminophen (NORCO/VICODIN) 5-325 MG per tablet Take 1 tablet by mouth every 6 (six) hours as needed for moderate pain. 01/01/14   Georgann HousekeeperHusain, Karrar, MD  ibuprofen (ADVIL,MOTRIN) 800 MG tablet Take 800  mg by mouth every 8 (eight) hours as needed for headache.    [provider]  OVER THE COUNTER MEDICATION Take 1 tablet by mouth daily. Calcium gummies    [provider]  OVER THE COUNTER MEDICATION Take 1 tablet by mouth daily. Fish oil gummies    [provider]    Family History Family History  Problem Relation Age of Onset  . Leukemia Mother   . Scoliosis Mother   . Arthritis Mother   . Heart attack Brother   . Heart attack Brother     Social History Social History   Tobacco Use  . Smoking status: Never Smoker  . Smokeless tobacco: Never Used  Substance Use Topics  . Alcohol use: Yes    Comment: rare  . Drug use: No     Allergies   Aspirin; Codeine; Sulfa antibiotics; Tetracyclines & related; and Penicillins   Review of Systems Review of Systems  All other systems reviewed and are negative.    Physical Exam Updated Vital Signs BP (!) 143/91 (BP Location: Right Arm)   Pulse 64   Temp 98 F (36.7 C) (Oral)   Resp 13   Ht 1.549 m (5\' 1" )   Wt 56.7 kg (125 lb)   SpO2 100%   BMI 23.62 kg/m   Physical Exam  Constitutional: She is  oriented to person, place, and time. She appears well-developed and well-nourished.  Non-toxic appearance. No distress.  HENT:  Head: Normocephalic and atraumatic.  Eyes: Conjunctivae, EOM and lids are normal. Pupils are equal, round, and reactive to light.  Neck: Normal range of motion. Neck supple. No tracheal deviation present. No thyroid mass present.  Cardiovascular: Normal rate, regular rhythm and normal heart sounds. Exam reveals no gallop.  No murmur heard. Pulmonary/Chest: Effort normal and breath sounds normal. No stridor. No respiratory distress. She has no decreased breath sounds. She has no wheezes. She has no rhonchi. She has no rales.  Abdominal: Soft. Normal appearance and bowel sounds are normal. She exhibits no distension. There is no tenderness. There is no rebound and no CVA tenderness.   Musculoskeletal: Normal range of motion. She exhibits no edema or tenderness.  Neurological: She is alert and oriented to person, place, and time. She has normal strength. No cranial nerve deficit or sensory deficit. GCS eye subscore is 4. GCS verbal subscore is 5. GCS motor subscore is 6.  Skin: Skin is warm and dry. No abrasion and no rash noted.  Psychiatric: She has a normal mood and affect. Her speech is normal and behavior is normal.  Nursing note and vitals reviewed.    ED Treatments / Results  Labs (all labs ordered are listed, but only abnormal results are displayed) Labs Reviewed  URINALYSIS, ROUTINE W REFLEX MICROSCOPIC - Abnormal; Notable for the following components:      Result Value   Color, Urine STRAW (*)    Hgb urine dipstick LARGE (*)    All other components within normal limits  CBC - Abnormal; Notable for the following components:   WBC 12.8 (*)    All other components within normal limits  BASIC METABOLIC PANEL - Abnormal; Notable for the following components:   Glucose, Bld 103 (*)    BUN 21 (*)    Creatinine, Ser 1.08 (*)    GFR calc non Af Amer 52 (*)    GFR calc Af Amer 60 (*)    All other components within normal limits    EKG  EKG Interpretation None       Radiology No results found.  Procedures Procedures (including critical care time)  Medications Ordered in ED Medications - No data to display   Initial Impression / Assessment and Plan / ED Course  I have reviewed the triage vital signs and the nursing notes.  Pertinent labs & imaging results that were available during my care of the patient were reviewed by me and considered in my medical decision making (see chart for details).     Patient's pain is controlled here.  Patient's urine shows signs of hematuria and CT shows 4 mm left-sided kidney stone.  Will discharge home with analgesics as well as Flomax.  Give urology referral  Final Clinical Impressions(s) / ED Diagnoses    Final diagnoses:  None    ED Discharge Orders    None       Lorre NickAllen, Royanne Warshaw, MD 07/23/17 2119

## 2017-07-23 NOTE — ED Triage Notes (Signed)
Pt endorses left flank pain since yesterday evening with pain radiating down left lower abd/pelvis. [facility]. VSS.

## 2017-07-26 DIAGNOSIS — N201 Calculus of ureter: Secondary | ICD-10-CM | POA: Diagnosis not present

## 2017-07-27 ENCOUNTER — Other Ambulatory Visit: Payer: Self-pay | Admitting: Urology

## 2017-07-27 ENCOUNTER — Other Ambulatory Visit: Payer: Self-pay | Admitting: *Deleted

## 2017-07-27 ENCOUNTER — Encounter: Payer: Self-pay | Admitting: *Deleted

## 2017-07-27 NOTE — Patient Outreach (Signed)
[facility]) Care Management  07/27/2017 Alyssa James Oct 29, 1950 [id number]   Telephone Screen  Referral Date:  07/26/2017 [facility]  [facility] Reason for Referral:  6 or more ED visits in the past 6 months Insurance:   Outreach Attempt:  Successful telephone outreach to patient.  HIPAA verified.  Completed telephone screening.  Social:  Patient lives at home alone and reports being independent with her ADLs and IADLs.  Ambulates independently.  Only DME in the home is glasses and BP machine.  Patient reports she drives herself to her medical appointments.  Conditions:  Per chart review and discussion with patient, PMH include: hyperlipidemia, anxiety, migraines, Raynaud Disease, GERD, osteopenia, osteoarthritis, anemia, and recent diagnosis of kidney stone.  Patient reports recent left flank pain and hematuria.  Urgent Care visit sending her to Emergency Room where CT diagnosed kidney stone.  Patient reports typically she is fairly healthy and is usually the caregiver of the family.  Medications:  Patient reports only taking vitamins up until recent diagnosis of kidney stone.  She is currently taking Flomax and pain medication.  Denies any trouble affording medications at this time.  Appointments:  Patient reports seeing her primary care provider, Dr. Donette LarryHusain on March 27 for her yearly physical.  Her next appointment with Dr. Donette LarryHusain will be in March 2019.  Reports having seen the Urologist, Dr. Berneice HeinrichManny yesterday, December 11.  Will schedule follow up appointment if she is unable to pass kidney stone on her own this weekend, per physician instructions.  Advanced Directives:  Patient reports having a Living Will in place and at this time does not want to make any changes to advance directive.   Consent:  Screening process completed.  Reviewed and discussed [facility] services.  At this time patient does not wish to participate in any [facility] services.  Plan: RN Health  Coach will send patient successful outreach letter. RN Health Coach reviewed the 24 Hour Nurse Line available for patient. RN Health Coach will notify [facility] administrative assistant of case closure status.  Alyssa LernerFarrah Simya Tercero RN [facility] Care Management  RN Health Coach [phone] [email or web address]

## 2017-07-29 ENCOUNTER — Other Ambulatory Visit: Payer: Self-pay

## 2017-07-29 ENCOUNTER — Encounter (HOSPITAL_BASED_OUTPATIENT_CLINIC_OR_DEPARTMENT_OTHER): Payer: Self-pay | Admitting: *Deleted

## 2017-07-29 NOTE — Progress Notes (Signed)
Pt instructed npo pmn 12/18 x albuterol and bring inhaler with her.  [facility].  Cbc in epic, istat 8 on arrival.

## 2017-08-03 ENCOUNTER — Ambulatory Visit (HOSPITAL_BASED_OUTPATIENT_CLINIC_OR_DEPARTMENT_OTHER): Admission: RE | Admit: 2017-08-03 | Payer: Medicare HMO | Source: Ambulatory Visit | Admitting: Urology

## 2017-08-03 DIAGNOSIS — N201 Calculus of ureter: Secondary | ICD-10-CM | POA: Diagnosis not present

## 2017-08-03 HISTORY — DX: Unspecified cataract: H26.9

## 2017-08-03 HISTORY — DX: Nausea: R11.0

## 2017-08-03 HISTORY — DX: Peripheral vascular disease, unspecified: I73.9

## 2017-08-03 HISTORY — DX: Raynaud's syndrome without gangrene: I73.00

## 2017-08-03 HISTORY — DX: Anxiety disorder, unspecified: F41.9

## 2017-08-03 HISTORY — DX: Personal history of urinary calculi: Z87.442

## 2017-08-03 HISTORY — DX: Presence of spectacles and contact lenses: Z97.3

## 2017-08-03 HISTORY — DX: Presence of dental prosthetic device (complete) (partial): Z97.2

## 2017-08-03 SURGERY — CYSTOURETEROSCOPY, WITH RETROGRADE PYELOGRAM AND STENT INSERTION
Anesthesia: General | Laterality: Left

## 2017-08-19 DIAGNOSIS — M25561 Pain in right knee: Secondary | ICD-10-CM | POA: Diagnosis not present

## 2017-09-20 DIAGNOSIS — S93402A Sprain of unspecified ligament of left ankle, initial encounter: Secondary | ICD-10-CM | POA: Diagnosis not present

## 2017-10-03 DIAGNOSIS — R05 Cough: Secondary | ICD-10-CM | POA: Diagnosis not present

## 2017-10-03 DIAGNOSIS — J018 Other acute sinusitis: Secondary | ICD-10-CM | POA: Diagnosis not present

## 2017-10-03 DIAGNOSIS — R509 Fever, unspecified: Secondary | ICD-10-CM | POA: Diagnosis not present

## 2017-10-05 DIAGNOSIS — S93402A Sprain of unspecified ligament of left ankle, initial encounter: Secondary | ICD-10-CM | POA: Diagnosis not present

## 2017-10-26 DIAGNOSIS — S93402A Sprain of unspecified ligament of left ankle, initial encounter: Secondary | ICD-10-CM | POA: Diagnosis not present

## 2017-11-09 DIAGNOSIS — E559 Vitamin D deficiency, unspecified: Secondary | ICD-10-CM | POA: Diagnosis not present

## 2017-11-09 DIAGNOSIS — Z1389 Encounter for screening for other disorder: Secondary | ICD-10-CM | POA: Diagnosis not present

## 2017-11-09 DIAGNOSIS — J309 Allergic rhinitis, unspecified: Secondary | ICD-10-CM | POA: Diagnosis not present

## 2017-11-09 DIAGNOSIS — Z0000 Encounter for general adult medical examination without abnormal findings: Secondary | ICD-10-CM | POA: Diagnosis not present

## 2017-11-09 DIAGNOSIS — E782 Mixed hyperlipidemia: Secondary | ICD-10-CM | POA: Diagnosis not present

## 2017-11-09 DIAGNOSIS — G43909 Migraine, unspecified, not intractable, without status migrainosus: Secondary | ICD-10-CM | POA: Diagnosis not present

## 2017-11-09 DIAGNOSIS — K219 Gastro-esophageal reflux disease without esophagitis: Secondary | ICD-10-CM | POA: Diagnosis not present

## 2017-11-09 DIAGNOSIS — I700 Atherosclerosis of aorta: Secondary | ICD-10-CM | POA: Diagnosis not present

## 2017-11-09 DIAGNOSIS — M8580 Other specified disorders of bone density and structure, unspecified site: Secondary | ICD-10-CM | POA: Diagnosis not present

## 2017-12-26 DIAGNOSIS — J028 Acute pharyngitis due to other specified organisms: Secondary | ICD-10-CM | POA: Diagnosis not present

## 2017-12-26 DIAGNOSIS — R05 Cough: Secondary | ICD-10-CM | POA: Diagnosis not present

## 2018-01-05 IMAGING — CT CT RENAL STONE PROTOCOL
2 of 4 series · 16 of 46 positions shown, 18 images · non-contrast
Comparison: None.

CLINICAL DATA: Left flank pain

EXAM:
CT ABDOMEN AND PELVIS WITHOUT CONTRAST
TECHNIQUE: Multidetector CT imaging of the abdomen and pelvis was performed
following the standard protocol without oral or intravenous contrast
material administration.

[Series 3: stone study 5.0 i30f 2 · axial · 0.79mm/px · z∈[-377,+3]mm · 13 of 84 slices shown, 15 images]
[im 4/84  soft-tissue]
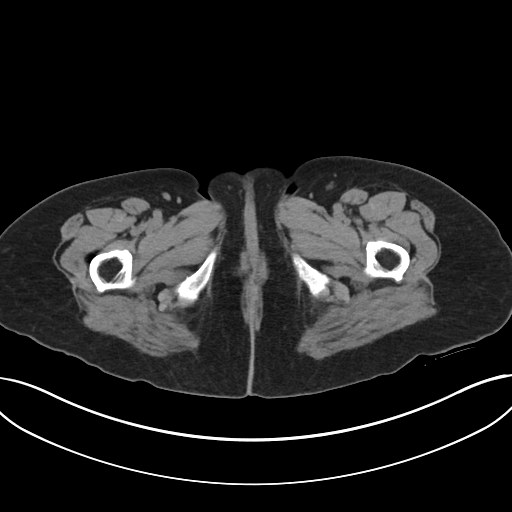
[im 4/84  bone]
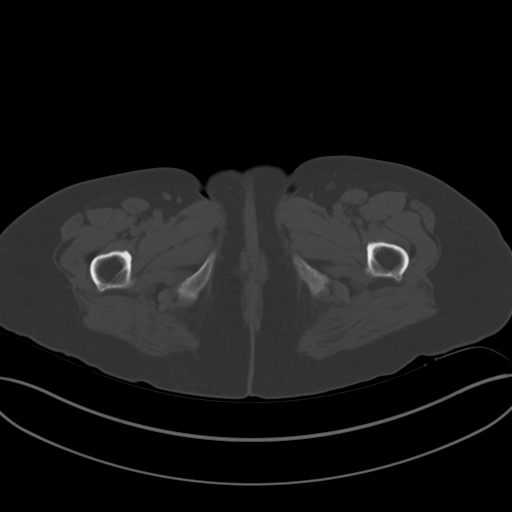
[im 11/84  soft-tissue]
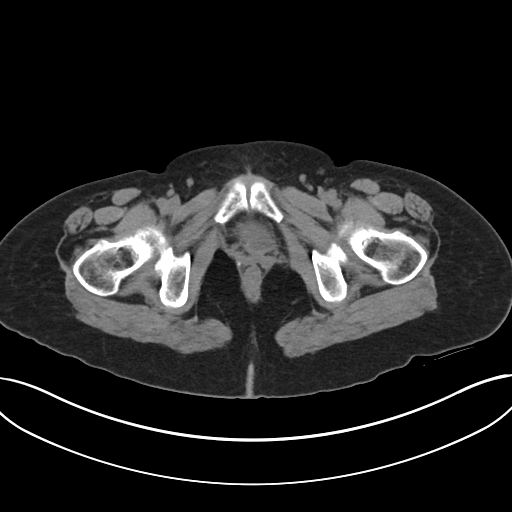
[im 18/84  soft-tissue]
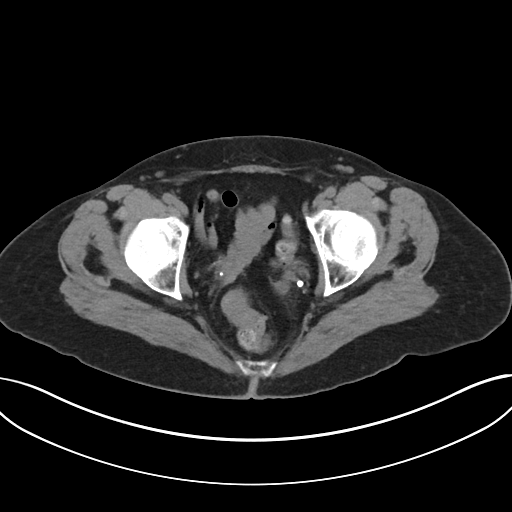
[im 25/84  soft-tissue]
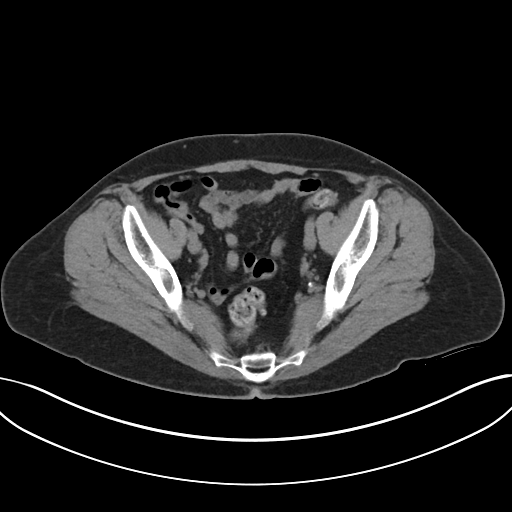
[im 28/84  soft-tissue]
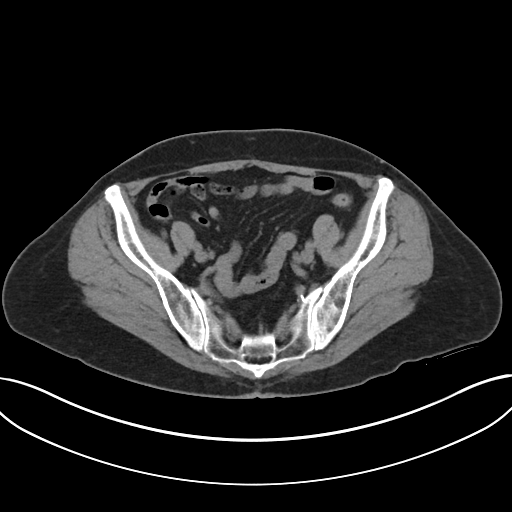
[im 35/84  soft-tissue]
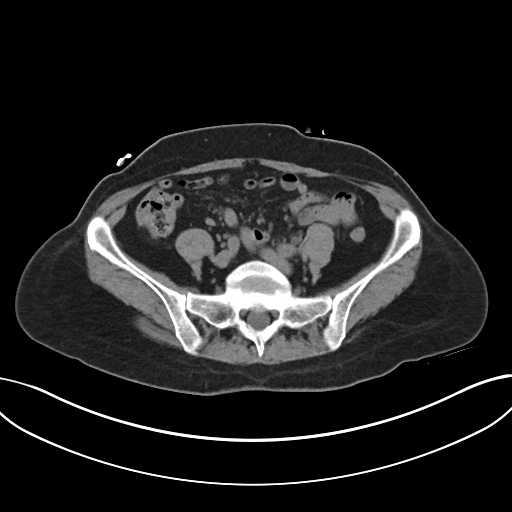
[im 42/84  soft-tissue]
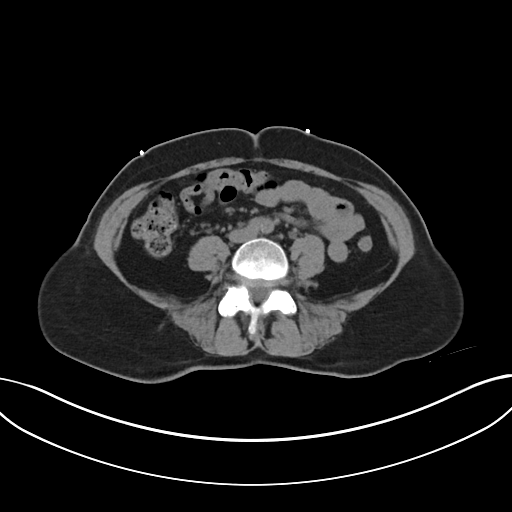
[im 49/84  soft-tissue]
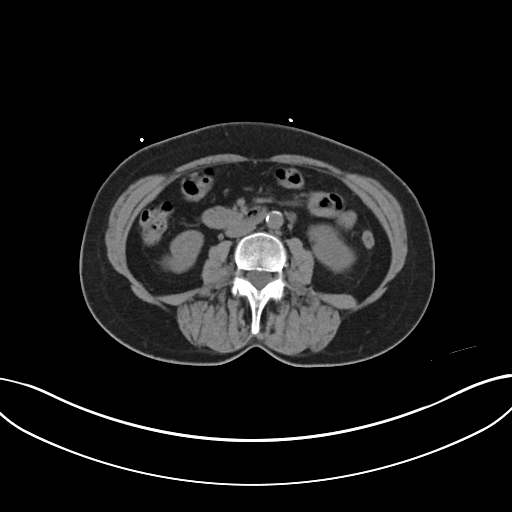
[im 56/84  soft-tissue]
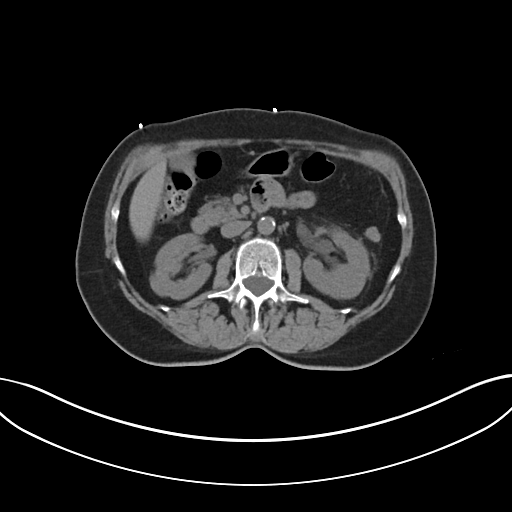
[im 56/84  bone]
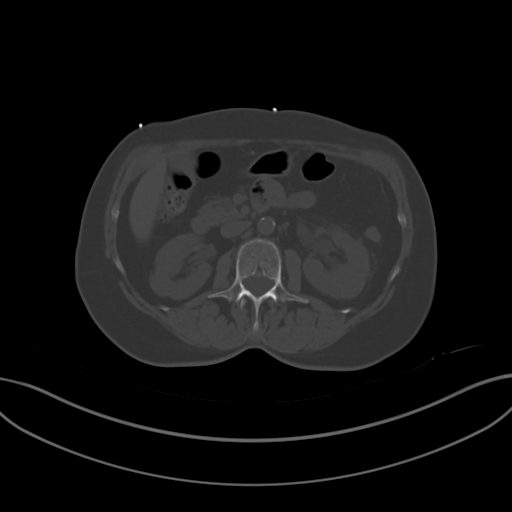
[im 59/84  soft-tissue]
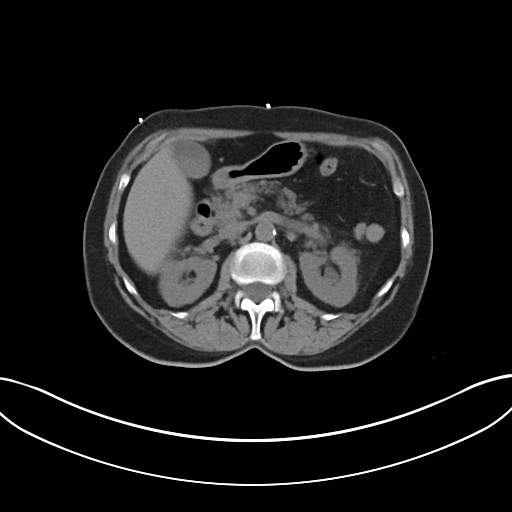
[im 66/84  soft-tissue]
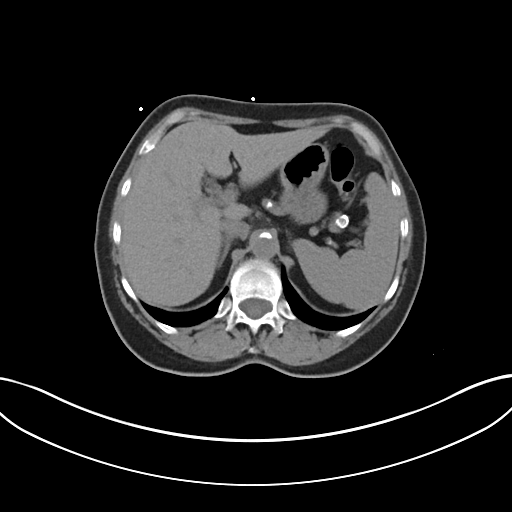
[im 73/84  soft-tissue]
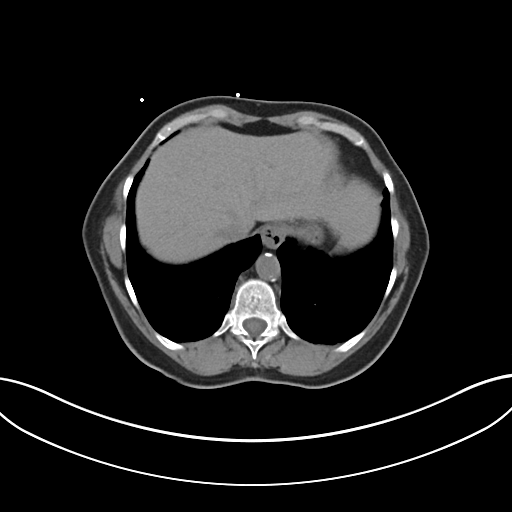
[im 80/84  soft-tissue]
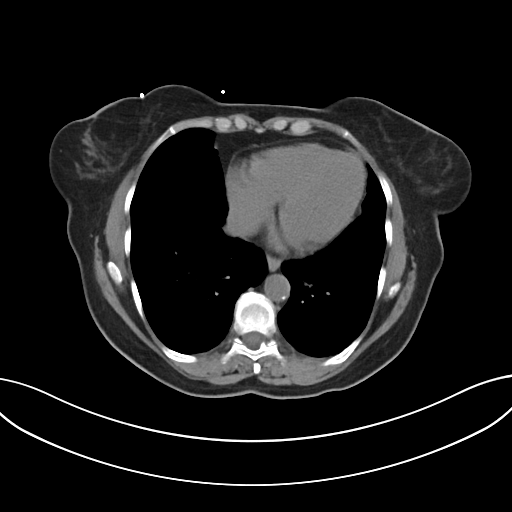

[Series 6: coronal soft tissue · coronal · 0.72mm/px · 3 of 101 slices shown]
[im 34/101  soft-tissue]
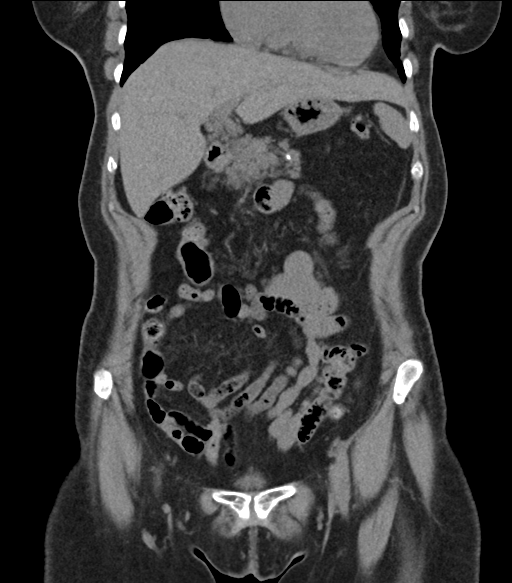
[im 45/101  soft-tissue]
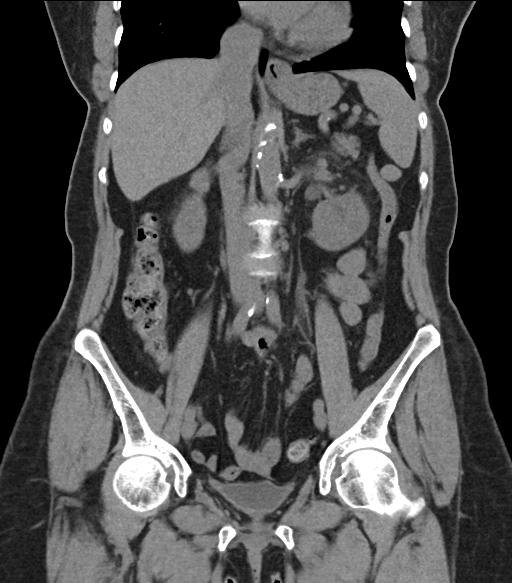
[im 56/101  soft-tissue]
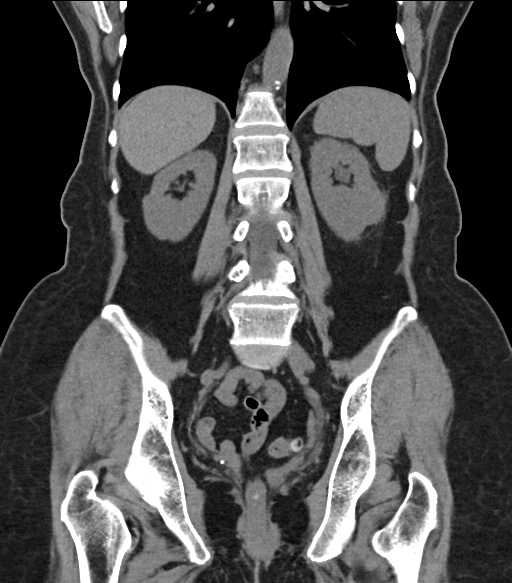

[16 of 46 positions shown; findings below may reference images not displayed]

FINDINGS: Lower chest: There is minimal scarring in the left base. Lung bases
otherwise are clear. There is a small hiatal hernia.

Hepatobiliary: No focal liver lesions are evident on this
noncontrast enhanced study. Gallbladder wall is not appreciably
thickened. No biliary duct dilatation is evident.

Pancreas: There is no pancreatic mass or inflammatory focus.

Spleen: No splenic lesions are evident.

Adrenals/Urinary Tract: Adrenals appear normal bilaterally. There is
a 1 cm cyst in the anterior mid right kidney. There is subtle
perinephric edema on the left. There is moderate hydronephrosis on
the left. There is no appreciable hydronephrosis on the right. There
is no intrarenal calculus on either side. On the left, there is a 4
x 4 mm calculus in the distal left ureter near the ureterovesical
junction. No other ureteral calculus is identified on either side.
Several phleboliths are present in the pelvis near but separate from
the ureters. Urinary bladder is midline with wall thickness within
normal limits for degree of distention.

Stomach/Bowel: There are multiple sigmoid diverticula without
diverticulitis. There is no appreciable bowel wall or mesenteric
thickening. No bowel obstruction. No free air or portal venous air.

Vascular/Lymphatic: There is atherosclerotic calcification in the
aorta and common iliac arteries. No evident aneurysm. Major
mesenteric vessels appear patent on this noncontrast enhanced study.
There is no evident adenopathy in the abdomen or pelvis.

Reproductive: Uterus is absent.  There is no evident pelvic mass.

Other: Appendix appears unremarkable. There is no ascites or abscess
in the abdomen or pelvis.

Musculoskeletal: There is degenerative change in lower lumbar spine.
There are no blastic or lytic bone lesions. There is no
intramuscular or abdominal wall lesion.
IMPRESSION: 1. 4 mm calculus distal left ureter with moderate hydronephrosis on
the left. Left kidney is subtly edematous.

2. Sigmoid diverticula without diverticulitis. No bowel obstruction.

3.  No abscess.  Appendix appears normal.

4.  Small hiatal hernia.

5.  There is aortoiliac atherosclerosis.

6.  Uterus absent.

Aortic Atherosclerosis ([id number]).

## 2018-05-31 DIAGNOSIS — R69 Illness, unspecified: Secondary | ICD-10-CM | POA: Diagnosis not present

## 2018-06-01 DIAGNOSIS — Z23 Encounter for immunization: Secondary | ICD-10-CM | POA: Diagnosis not present

## 2018-06-12 ENCOUNTER — Other Ambulatory Visit: Payer: Self-pay | Admitting: Nurse Practitioner

## 2018-06-12 DIAGNOSIS — R1013 Epigastric pain: Secondary | ICD-10-CM

## 2018-06-13 ENCOUNTER — Ambulatory Visit
Admission: RE | Admit: 2018-06-13 | Discharge: 2018-06-13 | Disposition: A | Discharge status: Home / Self Care | Payer: Medicare HMO | Source: Ambulatory Visit | Attending: Nurse Practitioner | Admitting: Nurse Practitioner

## 2018-06-13 DIAGNOSIS — R1013 Epigastric pain: Secondary | ICD-10-CM

## 2018-06-22 DIAGNOSIS — R1013 Epigastric pain: Secondary | ICD-10-CM | POA: Diagnosis not present

## 2018-06-27 DIAGNOSIS — R0602 Shortness of breath: Secondary | ICD-10-CM | POA: Diagnosis not present

## 2018-06-27 DIAGNOSIS — R079 Chest pain, unspecified: Secondary | ICD-10-CM | POA: Diagnosis not present

## 2018-06-27 DIAGNOSIS — K219 Gastro-esophageal reflux disease without esophagitis: Secondary | ICD-10-CM | POA: Diagnosis not present

## 2018-06-27 DIAGNOSIS — Z8249 Family history of ischemic heart disease and other diseases of the circulatory system: Secondary | ICD-10-CM | POA: Diagnosis not present

## 2018-07-12 DIAGNOSIS — R079 Chest pain, unspecified: Secondary | ICD-10-CM | POA: Diagnosis not present

## 2018-07-17 DIAGNOSIS — R0789 Other chest pain: Secondary | ICD-10-CM | POA: Diagnosis not present

## 2018-07-25 DIAGNOSIS — K219 Gastro-esophageal reflux disease without esophagitis: Secondary | ICD-10-CM | POA: Diagnosis not present

## 2018-07-25 DIAGNOSIS — Z8249 Family history of ischemic heart disease and other diseases of the circulatory system: Secondary | ICD-10-CM | POA: Diagnosis not present

## 2018-07-25 DIAGNOSIS — R1013 Epigastric pain: Secondary | ICD-10-CM | POA: Diagnosis not present

## 2018-07-25 DIAGNOSIS — R079 Chest pain, unspecified: Secondary | ICD-10-CM | POA: Diagnosis not present

## 2018-07-25 DIAGNOSIS — R0602 Shortness of breath: Secondary | ICD-10-CM | POA: Diagnosis not present

## 2018-09-06 DIAGNOSIS — M67911 Unspecified disorder of synovium and tendon, right shoulder: Secondary | ICD-10-CM | POA: Diagnosis not present

## 2018-09-21 ENCOUNTER — Other Ambulatory Visit: Payer: Self-pay | Admitting: Internal Medicine

## 2018-09-21 ENCOUNTER — Ambulatory Visit
Admission: RE | Admit: 2018-09-21 | Discharge: 2018-09-21 | Disposition: A | Discharge status: Home / Self Care | Payer: Medicare PPO | Source: Ambulatory Visit | Attending: Internal Medicine | Admitting: Internal Medicine

## 2018-09-21 DIAGNOSIS — R51 Headache: Secondary | ICD-10-CM | POA: Diagnosis not present

## 2018-09-21 DIAGNOSIS — R42 Dizziness and giddiness: Secondary | ICD-10-CM

## 2018-09-21 DIAGNOSIS — R519 Headache, unspecified: Secondary | ICD-10-CM

## 2018-10-18 DIAGNOSIS — J45909 Unspecified asthma, uncomplicated: Secondary | ICD-10-CM | POA: Diagnosis not present

## 2018-10-18 DIAGNOSIS — J209 Acute bronchitis, unspecified: Secondary | ICD-10-CM | POA: Diagnosis not present

## 2019-02-08 DIAGNOSIS — J45909 Unspecified asthma, uncomplicated: Secondary | ICD-10-CM | POA: Diagnosis not present

## 2019-02-08 DIAGNOSIS — R0981 Nasal congestion: Secondary | ICD-10-CM | POA: Diagnosis not present

## 2019-02-20 DIAGNOSIS — J45909 Unspecified asthma, uncomplicated: Secondary | ICD-10-CM | POA: Diagnosis not present

## 2019-02-20 DIAGNOSIS — G43909 Migraine, unspecified, not intractable, without status migrainosus: Secondary | ICD-10-CM | POA: Diagnosis not present

## 2019-02-20 DIAGNOSIS — E782 Mixed hyperlipidemia: Secondary | ICD-10-CM | POA: Diagnosis not present

## 2019-02-20 DIAGNOSIS — M8580 Other specified disorders of bone density and structure, unspecified site: Secondary | ICD-10-CM | POA: Diagnosis not present

## 2019-02-20 DIAGNOSIS — I700 Atherosclerosis of aorta: Secondary | ICD-10-CM | POA: Diagnosis not present

## 2019-02-20 DIAGNOSIS — J309 Allergic rhinitis, unspecified: Secondary | ICD-10-CM | POA: Diagnosis not present

## 2019-02-20 DIAGNOSIS — K219 Gastro-esophageal reflux disease without esophagitis: Secondary | ICD-10-CM | POA: Diagnosis not present

## 2019-02-20 DIAGNOSIS — I7300 Raynaud's syndrome without gangrene: Secondary | ICD-10-CM | POA: Diagnosis not present

## 2019-02-22 DIAGNOSIS — Z8262 Family history of osteoporosis: Secondary | ICD-10-CM | POA: Diagnosis not present

## 2019-02-22 DIAGNOSIS — M810 Age-related osteoporosis without current pathological fracture: Secondary | ICD-10-CM | POA: Diagnosis not present

## 2019-02-22 DIAGNOSIS — Z90710 Acquired absence of both cervix and uterus: Secondary | ICD-10-CM | POA: Diagnosis not present

## 2019-02-28 DIAGNOSIS — M810 Age-related osteoporosis without current pathological fracture: Secondary | ICD-10-CM | POA: Diagnosis not present

## 2019-03-19 DIAGNOSIS — Z1231 Encounter for screening mammogram for malignant neoplasm of breast: Secondary | ICD-10-CM | POA: Diagnosis not present

## 2019-03-19 DIAGNOSIS — Z01419 Encounter for gynecological examination (general) (routine) without abnormal findings: Secondary | ICD-10-CM | POA: Diagnosis not present

## 2019-03-20 DIAGNOSIS — H2513 Age-related nuclear cataract, bilateral: Secondary | ICD-10-CM | POA: Diagnosis not present

## 2019-03-20 DIAGNOSIS — H5203 Hypermetropia, bilateral: Secondary | ICD-10-CM | POA: Diagnosis not present

## 2019-03-26 DIAGNOSIS — J309 Allergic rhinitis, unspecified: Secondary | ICD-10-CM | POA: Diagnosis not present

## 2019-03-26 DIAGNOSIS — M8580 Other specified disorders of bone density and structure, unspecified site: Secondary | ICD-10-CM | POA: Diagnosis not present

## 2019-03-26 DIAGNOSIS — J45909 Unspecified asthma, uncomplicated: Secondary | ICD-10-CM | POA: Diagnosis not present

## 2019-05-10 DIAGNOSIS — Z23 Encounter for immunization: Secondary | ICD-10-CM | POA: Diagnosis not present

## 2019-08-03 DIAGNOSIS — J069 Acute upper respiratory infection, unspecified: Secondary | ICD-10-CM | POA: Diagnosis not present

## 2019-08-03 DIAGNOSIS — R21 Rash and other nonspecific skin eruption: Secondary | ICD-10-CM | POA: Diagnosis not present

## 2020-07-09 ENCOUNTER — Ambulatory Visit
Admission: RE | Admit: 2020-07-09 | Discharge: 2020-07-09 | Disposition: A | Discharge status: Home / Self Care | Payer: Medicare Other | Source: Ambulatory Visit | Attending: Physician Assistant | Admitting: Physician Assistant

## 2020-07-09 ENCOUNTER — Other Ambulatory Visit: Payer: Self-pay | Admitting: Physician Assistant

## 2020-07-09 DIAGNOSIS — R509 Fever, unspecified: Secondary | ICD-10-CM

## 2020-07-09 DIAGNOSIS — R52 Pain, unspecified: Secondary | ICD-10-CM

## 2020-07-09 DIAGNOSIS — R059 Cough, unspecified: Secondary | ICD-10-CM

## 2020-07-09 DIAGNOSIS — R0781 Pleurodynia: Secondary | ICD-10-CM

## 2020-09-10 DIAGNOSIS — E782 Mixed hyperlipidemia: Secondary | ICD-10-CM | POA: Diagnosis not present

## 2020-09-10 DIAGNOSIS — J45909 Unspecified asthma, uncomplicated: Secondary | ICD-10-CM | POA: Diagnosis not present

## 2020-09-10 DIAGNOSIS — M810 Age-related osteoporosis without current pathological fracture: Secondary | ICD-10-CM | POA: Diagnosis not present

## 2020-09-10 DIAGNOSIS — K219 Gastro-esophageal reflux disease without esophagitis: Secondary | ICD-10-CM | POA: Diagnosis not present

## 2020-09-10 DIAGNOSIS — I7300 Raynaud's syndrome without gangrene: Secondary | ICD-10-CM | POA: Diagnosis not present

## 2020-09-10 DIAGNOSIS — G43909 Migraine, unspecified, not intractable, without status migrainosus: Secondary | ICD-10-CM | POA: Diagnosis not present

## 2020-09-10 DIAGNOSIS — I700 Atherosclerosis of aorta: Secondary | ICD-10-CM | POA: Diagnosis not present

## 2020-11-10 DIAGNOSIS — H43813 Vitreous degeneration, bilateral: Secondary | ICD-10-CM | POA: Diagnosis not present

## 2020-11-10 DIAGNOSIS — H16223 Keratoconjunctivitis sicca, not specified as Sjogren's, bilateral: Secondary | ICD-10-CM | POA: Diagnosis not present

## 2020-11-10 DIAGNOSIS — H2513 Age-related nuclear cataract, bilateral: Secondary | ICD-10-CM | POA: Diagnosis not present

## 2020-11-10 DIAGNOSIS — H524 Presbyopia: Secondary | ICD-10-CM | POA: Diagnosis not present

## 2020-11-10 DIAGNOSIS — G43109 Migraine with aura, not intractable, without status migrainosus: Secondary | ICD-10-CM | POA: Diagnosis not present

## 2021-02-02 DIAGNOSIS — N764 Abscess of vulva: Secondary | ICD-10-CM | POA: Diagnosis not present

## 2021-02-04 DIAGNOSIS — N764 Abscess of vulva: Secondary | ICD-10-CM | POA: Diagnosis not present

## 2021-03-06 DIAGNOSIS — Z1389 Encounter for screening for other disorder: Secondary | ICD-10-CM | POA: Diagnosis not present

## 2021-03-06 DIAGNOSIS — E559 Vitamin D deficiency, unspecified: Secondary | ICD-10-CM | POA: Diagnosis not present

## 2021-03-06 DIAGNOSIS — J45909 Unspecified asthma, uncomplicated: Secondary | ICD-10-CM | POA: Diagnosis not present

## 2021-03-06 DIAGNOSIS — K219 Gastro-esophageal reflux disease without esophagitis: Secondary | ICD-10-CM | POA: Diagnosis not present

## 2021-03-06 DIAGNOSIS — F419 Anxiety disorder, unspecified: Secondary | ICD-10-CM | POA: Diagnosis not present

## 2021-03-06 DIAGNOSIS — M810 Age-related osteoporosis without current pathological fracture: Secondary | ICD-10-CM | POA: Diagnosis not present

## 2021-03-06 DIAGNOSIS — J309 Allergic rhinitis, unspecified: Secondary | ICD-10-CM | POA: Diagnosis not present

## 2021-03-06 DIAGNOSIS — E782 Mixed hyperlipidemia: Secondary | ICD-10-CM | POA: Diagnosis not present

## 2021-03-06 DIAGNOSIS — R42 Dizziness and giddiness: Secondary | ICD-10-CM | POA: Diagnosis not present

## 2021-03-06 DIAGNOSIS — Z1159 Encounter for screening for other viral diseases: Secondary | ICD-10-CM | POA: Diagnosis not present

## 2021-03-06 DIAGNOSIS — G43909 Migraine, unspecified, not intractable, without status migrainosus: Secondary | ICD-10-CM | POA: Diagnosis not present

## 2021-03-06 DIAGNOSIS — Z0000 Encounter for general adult medical examination without abnormal findings: Secondary | ICD-10-CM | POA: Diagnosis not present

## 2021-03-06 DIAGNOSIS — I700 Atherosclerosis of aorta: Secondary | ICD-10-CM | POA: Diagnosis not present

## 2021-05-05 DIAGNOSIS — U071 COVID-19: Secondary | ICD-10-CM | POA: Diagnosis not present

## 2021-05-13 DIAGNOSIS — G43109 Migraine with aura, not intractable, without status migrainosus: Secondary | ICD-10-CM | POA: Diagnosis not present

## 2021-05-13 DIAGNOSIS — H43813 Vitreous degeneration, bilateral: Secondary | ICD-10-CM | POA: Diagnosis not present

## 2021-05-13 DIAGNOSIS — H16223 Keratoconjunctivitis sicca, not specified as Sjogren's, bilateral: Secondary | ICD-10-CM | POA: Diagnosis not present

## 2021-05-13 DIAGNOSIS — H524 Presbyopia: Secondary | ICD-10-CM | POA: Diagnosis not present

## 2021-05-13 DIAGNOSIS — H35443 Age-related reticular degeneration of retina, bilateral: Secondary | ICD-10-CM | POA: Diagnosis not present

## 2021-05-13 DIAGNOSIS — H2513 Age-related nuclear cataract, bilateral: Secondary | ICD-10-CM | POA: Diagnosis not present

## 2021-05-23 DIAGNOSIS — Z23 Encounter for immunization: Secondary | ICD-10-CM | POA: Diagnosis not present

## 2021-05-28 DIAGNOSIS — Z1231 Encounter for screening mammogram for malignant neoplasm of breast: Secondary | ICD-10-CM | POA: Diagnosis not present

## 2021-05-28 DIAGNOSIS — Z01419 Encounter for gynecological examination (general) (routine) without abnormal findings: Secondary | ICD-10-CM | POA: Diagnosis not present

## 2021-06-01 DIAGNOSIS — H6690 Otitis media, unspecified, unspecified ear: Secondary | ICD-10-CM | POA: Diagnosis not present

## 2021-06-03 ENCOUNTER — Other Ambulatory Visit: Payer: Self-pay | Admitting: Obstetrics

## 2021-06-03 DIAGNOSIS — R928 Other abnormal and inconclusive findings on diagnostic imaging of breast: Secondary | ICD-10-CM

## 2021-06-24 ENCOUNTER — Ambulatory Visit
Admission: RE | Admit: 2021-06-24 | Discharge: 2021-06-24 | Disposition: A | Discharge status: Home / Self Care | Payer: Medicare Other | Source: Ambulatory Visit | Attending: Obstetrics | Admitting: Obstetrics

## 2021-06-24 ENCOUNTER — Other Ambulatory Visit: Payer: Self-pay

## 2021-06-24 DIAGNOSIS — R928 Other abnormal and inconclusive findings on diagnostic imaging of breast: Secondary | ICD-10-CM

## 2021-06-24 DIAGNOSIS — R922 Inconclusive mammogram: Secondary | ICD-10-CM | POA: Diagnosis not present

## 2021-08-26 DIAGNOSIS — I700 Atherosclerosis of aorta: Secondary | ICD-10-CM | POA: Diagnosis not present

## 2021-08-26 DIAGNOSIS — G43909 Migraine, unspecified, not intractable, without status migrainosus: Secondary | ICD-10-CM | POA: Diagnosis not present

## 2021-08-26 DIAGNOSIS — K219 Gastro-esophageal reflux disease without esophagitis: Secondary | ICD-10-CM | POA: Diagnosis not present

## 2021-08-26 DIAGNOSIS — J45909 Unspecified asthma, uncomplicated: Secondary | ICD-10-CM | POA: Diagnosis not present

## 2021-08-26 DIAGNOSIS — F419 Anxiety disorder, unspecified: Secondary | ICD-10-CM | POA: Diagnosis not present

## 2021-08-26 DIAGNOSIS — J309 Allergic rhinitis, unspecified: Secondary | ICD-10-CM | POA: Diagnosis not present

## 2021-08-26 DIAGNOSIS — I7300 Raynaud's syndrome without gangrene: Secondary | ICD-10-CM | POA: Diagnosis not present

## 2021-08-26 DIAGNOSIS — I10 Essential (primary) hypertension: Secondary | ICD-10-CM | POA: Diagnosis not present

## 2021-08-26 DIAGNOSIS — E782 Mixed hyperlipidemia: Secondary | ICD-10-CM | POA: Diagnosis not present

## 2021-09-09 DIAGNOSIS — H6123 Impacted cerumen, bilateral: Secondary | ICD-10-CM | POA: Diagnosis not present

## 2021-09-09 DIAGNOSIS — H608X3 Other otitis externa, bilateral: Secondary | ICD-10-CM | POA: Diagnosis not present

## 2021-09-09 DIAGNOSIS — H903 Sensorineural hearing loss, bilateral: Secondary | ICD-10-CM | POA: Diagnosis not present

## 2021-09-09 DIAGNOSIS — H838X3 Other specified diseases of inner ear, bilateral: Secondary | ICD-10-CM | POA: Diagnosis not present

## 2021-10-01 DIAGNOSIS — I10 Essential (primary) hypertension: Secondary | ICD-10-CM | POA: Diagnosis not present

## 2021-10-01 DIAGNOSIS — L989 Disorder of the skin and subcutaneous tissue, unspecified: Secondary | ICD-10-CM | POA: Diagnosis not present

## 2021-10-22 DIAGNOSIS — L820 Inflamed seborrheic keratosis: Secondary | ICD-10-CM | POA: Diagnosis not present

## 2022-03-10 DIAGNOSIS — G43909 Migraine, unspecified, not intractable, without status migrainosus: Secondary | ICD-10-CM | POA: Diagnosis not present

## 2022-03-10 DIAGNOSIS — I700 Atherosclerosis of aorta: Secondary | ICD-10-CM | POA: Diagnosis not present

## 2022-03-10 DIAGNOSIS — Z0000 Encounter for general adult medical examination without abnormal findings: Secondary | ICD-10-CM | POA: Diagnosis not present

## 2022-03-10 DIAGNOSIS — F419 Anxiety disorder, unspecified: Secondary | ICD-10-CM | POA: Diagnosis not present

## 2022-03-10 DIAGNOSIS — Z1331 Encounter for screening for depression: Secondary | ICD-10-CM | POA: Diagnosis not present

## 2022-03-10 DIAGNOSIS — I10 Essential (primary) hypertension: Secondary | ICD-10-CM | POA: Diagnosis not present

## 2022-03-10 DIAGNOSIS — J45909 Unspecified asthma, uncomplicated: Secondary | ICD-10-CM | POA: Diagnosis not present

## 2022-03-10 DIAGNOSIS — Z23 Encounter for immunization: Secondary | ICD-10-CM | POA: Diagnosis not present

## 2022-03-10 DIAGNOSIS — E782 Mixed hyperlipidemia: Secondary | ICD-10-CM | POA: Diagnosis not present

## 2022-03-10 DIAGNOSIS — I7300 Raynaud's syndrome without gangrene: Secondary | ICD-10-CM | POA: Diagnosis not present

## 2022-03-10 DIAGNOSIS — M810 Age-related osteoporosis without current pathological fracture: Secondary | ICD-10-CM | POA: Diagnosis not present

## 2022-03-16 DIAGNOSIS — M85852 Other specified disorders of bone density and structure, left thigh: Secondary | ICD-10-CM | POA: Diagnosis not present

## 2022-03-16 DIAGNOSIS — M810 Age-related osteoporosis without current pathological fracture: Secondary | ICD-10-CM | POA: Diagnosis not present

## 2022-03-23 DIAGNOSIS — H903 Sensorineural hearing loss, bilateral: Secondary | ICD-10-CM | POA: Diagnosis not present

## 2022-03-23 DIAGNOSIS — H6123 Impacted cerumen, bilateral: Secondary | ICD-10-CM | POA: Diagnosis not present

## 2022-03-23 DIAGNOSIS — H608X3 Other otitis externa, bilateral: Secondary | ICD-10-CM | POA: Diagnosis not present

## 2022-03-30 ENCOUNTER — Other Ambulatory Visit: Payer: Self-pay

## 2022-03-30 ENCOUNTER — Emergency Department (HOSPITAL_BASED_OUTPATIENT_CLINIC_OR_DEPARTMENT_OTHER)
Admission: EM | Admit: 2022-03-30 | Discharge: 2022-03-30 | Disposition: A | Discharge status: Home / Self Care | Payer: Medicare Other | Attending: Emergency Medicine | Admitting: Emergency Medicine

## 2022-03-30 ENCOUNTER — Emergency Department (HOSPITAL_BASED_OUTPATIENT_CLINIC_OR_DEPARTMENT_OTHER): Payer: Medicare Other

## 2022-03-30 ENCOUNTER — Other Ambulatory Visit (HOSPITAL_BASED_OUTPATIENT_CLINIC_OR_DEPARTMENT_OTHER): Payer: Self-pay

## 2022-03-30 ENCOUNTER — Encounter (HOSPITAL_BASED_OUTPATIENT_CLINIC_OR_DEPARTMENT_OTHER): Payer: Self-pay | Admitting: Urology

## 2022-03-30 DIAGNOSIS — N132 Hydronephrosis with renal and ureteral calculous obstruction: Secondary | ICD-10-CM | POA: Diagnosis not present

## 2022-03-30 DIAGNOSIS — N201 Calculus of ureter: Secondary | ICD-10-CM | POA: Insufficient documentation

## 2022-03-30 DIAGNOSIS — Z7982 Long term (current) use of aspirin: Secondary | ICD-10-CM | POA: Insufficient documentation

## 2022-03-30 DIAGNOSIS — M160 Bilateral primary osteoarthritis of hip: Secondary | ICD-10-CM | POA: Diagnosis not present

## 2022-03-30 DIAGNOSIS — K449 Diaphragmatic hernia without obstruction or gangrene: Secondary | ICD-10-CM | POA: Diagnosis not present

## 2022-03-30 DIAGNOSIS — K5730 Diverticulosis of large intestine without perforation or abscess without bleeding: Secondary | ICD-10-CM | POA: Diagnosis not present

## 2022-03-30 DIAGNOSIS — R109 Unspecified abdominal pain: Secondary | ICD-10-CM | POA: Diagnosis present

## 2022-03-30 HISTORY — DX: Essential (primary) hypertension: I10

## 2022-03-30 LAB — CBC WITH DIFFERENTIAL/PLATELET
Abs Immature Granulocytes: 0.04 10*3/uL (ref 0.00–0.07)
Basophils Absolute: 0 10*3/uL (ref 0.0–0.1)
Basophils Relative: 0 %
Eosinophils Absolute: 0 10*3/uL (ref 0.0–0.5)
Eosinophils Relative: 0 %
HCT: 40.3 % (ref 36.0–46.0)
Hemoglobin: 13.5 g/dL (ref 12.0–15.0)
Immature Granulocytes: 0 %
Lymphocytes Relative: 9 %
Lymphs Abs: 0.9 10*3/uL (ref 0.7–4.0)
MCH: 29.2 pg (ref 26.0–34.0)
MCHC: 33.5 g/dL (ref 30.0–36.0)
MCV: 87 fL (ref 80.0–100.0)
Monocytes Absolute: 0.4 10*3/uL (ref 0.1–1.0)
Monocytes Relative: 4 %
Neutro Abs: 8.5 10*3/uL — ABNORMAL HIGH (ref 1.7–7.7)
Neutrophils Relative %: 87 %
Platelets: 230 10*3/uL (ref 150–400)
RBC: 4.63 MIL/uL (ref 3.87–5.11)
RDW: 12.3 % (ref 11.5–15.5)
WBC: 9.8 10*3/uL (ref 4.0–10.5)
nRBC: 0 % (ref 0.0–0.2)

## 2022-03-30 LAB — URINALYSIS, MICROSCOPIC (REFLEX): RBC / HPF: >50 RBC/hpf (ref 0–5)

## 2022-03-30 LAB — BASIC METABOLIC PANEL
Anion gap: 9 (ref 5–15)
BUN: 23 mg/dL (ref 8–23)
CO2: 26 mmol/L (ref 22–32)
Calcium: 9.3 mg/dL (ref 8.9–10.3)
Chloride: 103 mmol/L (ref 98–111)
Creatinine, Ser: 1.02 mg/dL — ABNORMAL HIGH (ref 0.44–1.00)
GFR, Estimated: 58 mL/min — ABNORMAL LOW (ref >60)
Glucose, Bld: 97 mg/dL (ref 70–99)
Potassium: 4 mmol/L (ref 3.5–5.1)
Sodium: 138 mmol/L (ref 135–145)

## 2022-03-30 LAB — URINALYSIS, ROUTINE W REFLEX MICROSCOPIC
Bilirubin Urine: NEGATIVE
Glucose, UA: NEGATIVE mg/dL
Ketones, ur: NEGATIVE mg/dL
Leukocytes,Ua: NEGATIVE
Nitrite: NEGATIVE
Protein, ur: 100 mg/dL — AB
Specific Gravity, Urine: >=1.03 (ref 1.005–1.030)
pH: 5.5 (ref 5.0–8.0)

## 2022-03-30 MED ORDER — IBUPROFEN 800 MG PO TABS
800.0000 mg | ORAL_TABLET | Freq: Three times a day (TID) | ORAL | 0 refills | Status: DC | PRN
  Filled 2022-03-30: qty 30, 10d supply, fill #0

## 2022-03-30 MED ORDER — OXYCODONE-ACETAMINOPHEN 5-325 MG PO TABS: 1.0000 | ORAL_TABLET | Freq: Four times a day (QID) | ORAL | 0 refills | Status: DC | PRN

## 2022-03-30 NOTE — ED Provider Notes (Signed)
[facility] EMERGENCY DEPARTMENT Provider Note   CSN: [id number] Arrival date & time: 03/30/22  1354     History {Add pertinent medical, surgical, social history, OB history to HPI:1} Chief Complaint  Patient presents with   Flank Pain    Alyssa James is a 71 y.o. female.  She is here with a complaint of left lower quadrant pain that started a few hours ago while she was shopping.  She said it ultimately was severe and caused her to feel nauseous and she had a few episodes of vomiting.  Pain is similar to when she had a kidney stone a few years ago.  She took some ibuprofen and went to urgent care who recommended that she come here for evaluation.  She noticed some dark color to her urine but no dysuria and no fever.  Currently rates her pain as 7 out of 10.  No trauma  The history is provided by the patient.  Abdominal Pain Pain location:  LLQ Pain quality: aching   Pain radiates to:  Perineum Pain severity:  Severe Onset quality:  Sudden Duration:  3 hours Timing:  Intermittent Progression:  Improving Chronicity:  Recurrent Context: not sick contacts and not trauma   Relieved by:  Nothing Worsened by:  Nothing Ineffective treatments:  NSAIDs Associated symptoms: hematuria, nausea and vomiting   Associated symptoms: no chest pain, no diarrhea, no dysuria, no fever, no shortness of breath and no sore throat        Home Medications Prior to Admission medications   Medication Sig Start Date End Date Taking? Authorizing Provider  albuterol (PROVENTIL HFA;VENTOLIN HFA) 108 (90 BASE) MCG/ACT inhaler Inhale 2 puffs into the lungs every 6 (six) hours as needed for wheezing or shortness of breath.    [provider]  aspirin 81 MG chewable tablet Chew 81 mg by mouth daily.     [provider]  Calcium-Phosphorus-Vitamin D (CALCIUM GUMMIES PO) Take 2 tablets by mouth daily.    [provider]  Cholecalciferol (VITAMIN D3 ADULT GUMMIES PO) Take  2 tablets by mouth daily.     [provider]  HYDROcodone-acetaminophen (NORCO/VICODIN) 5-325 MG per tablet Take 1 tablet by mouth every 6 (six) hours as needed for moderate pain. Patient not taking: Reported on 07/23/2017 01/01/14   Georgann Housekeeper, MD  HYDROcodone-acetaminophen (NORCO/VICODIN) 5-325 MG tablet Take 1-2 tablets by mouth every 4 (four) hours as needed. 07/23/17   Lorre Nick, MD  ibuprofen (ADVIL,MOTRIN) 800 MG tablet Take 800 mg by mouth every 8 (eight) hours as needed.    [provider]  Multiple Vitamins-Minerals (ADULT GUMMY PO) Take 2 tablets by mouth daily.    [provider]  Omega-3 Fatty Acids (FISH OIL ADULT GUMMIES PO) Take 2 tablets by mouth daily.    [provider]  ondansetron (ZOFRAN ODT) 8 MG disintegrating tablet Take 1 tablet (8 mg total) by mouth every 8 (eight) hours as needed for nausea or vomiting. 07/23/17   Lorre Nick, MD  tamsulosin (FLOMAX) 0.4 MG CAPS capsule Take 1 capsule (0.4 mg total) by mouth daily. 07/23/17   Lorre Nick, MD  TRIAMCINOLONE ACETONIDE EX Apply 1 application topically daily as needed (rash).    [provider]      Allergies    Aspirin, Codeine, Sulfa antibiotics, Tetracyclines & related, Tizanidine, and Penicillins    Review of Systems   Review of Systems  Constitutional:  Negative for fever.  HENT:  Negative for sore  throat.   Respiratory:  Negative for shortness of breath.   Cardiovascular:  Negative for chest pain.  Gastrointestinal:  Positive for abdominal pain, nausea and vomiting. Negative for diarrhea.  Genitourinary:  Positive for hematuria. Negative for dysuria.  Skin:  Negative for rash.    Physical Exam Updated Vital Signs BP (!) 177/86 (BP Location: Left Arm)   Pulse 68   Temp 97.9 F (36.6 C) (Oral)   Resp 18   Ht 5\' 1"  (1.549 m)   Wt 57.2 kg   SpO2 100%   BMI 23.83 kg/m  Physical Exam Vitals and nursing note reviewed.  Constitutional:      General:  She is not in acute distress.    Appearance: Normal appearance. She is well-developed.  HENT:     Head: Normocephalic and atraumatic.  Eyes:     Conjunctiva/sclera: Conjunctivae normal.  Cardiovascular:     Rate and Rhythm: Normal rate and regular rhythm.     Heart sounds: No murmur heard. Pulmonary:     Effort: Pulmonary effort is normal. No respiratory distress.     Breath sounds: Normal breath sounds.  Abdominal:     Palpations: Abdomen is soft.     Tenderness: There is no abdominal tenderness. There is no guarding or rebound.  Musculoskeletal:     Cervical back: Neck supple.     Right lower leg: No edema.     Left lower leg: No edema.  Skin:    General: Skin is warm and dry.     Capillary Refill: Capillary refill takes less than 2 seconds.  Neurological:     General: No focal deficit present.     Mental Status: She is alert.     Sensory: No sensory deficit.     Motor: No weakness.     Gait: Gait normal.     ED Results / Procedures / Treatments   Labs (all labs ordered are listed, but only abnormal results are displayed) Labs Reviewed  URINALYSIS, ROUTINE W REFLEX MICROSCOPIC - Abnormal; Notable for the following components:      Result Value   Color, Urine AMBER (*)    APPearance CLOUDY (*)    Hgb urine dipstick LARGE (*)    Protein, ur 100 (*)    All other components within normal limits  URINALYSIS, MICROSCOPIC (REFLEX) - Abnormal; Notable for the following components:   Bacteria, UA MANY (*)    All other components within normal limits  CBC WITH DIFFERENTIAL/PLATELET  COMPREHENSIVE METABOLIC PANEL    EKG None  Radiology CT Renal Stone Study  Result Date: 03/30/2022 CLINICAL DATA:  Left flank pain radiating to the groin. History of renal stones. EXAM: CT ABDOMEN AND PELVIS WITHOUT CONTRAST TECHNIQUE: Multidetector CT imaging of the abdomen and pelvis was performed following the standard protocol without IV contrast. RADIATION DOSE REDUCTION: This exam was  performed according to the departmental dose-optimization program which includes automated exposure control, adjustment of the mA and/or kV according to patient size and/or use of iterative reconstruction technique. COMPARISON:  CT July 23, 2017 FINDINGS: Lower chest: No acute abnormality. Hepatobiliary: Unremarkable noncontrast enhanced appearance of the liver and gallbladder. No biliary ductal dilation. Pancreas: No pancreatic ductal dilation or evidence of acute inflammation. Spleen: No splenomegaly. Adrenals/Urinary Tract: Bilateral adrenal glands appear normal. Left hydroureteronephrosis with a 3 mm stone at the UVJ or layering dependently in the urinary bladder. No right-sided hydronephrosis. Urinary bladder is otherwise unremarkable for degree of distension. Stomach/Bowel: Small hiatal hernia. No pathologic  dilation of large or small bowel and no evidence of acute bowel inflammation. Left-sided colonic diverticulosis without findings of acute diverticulitis Vascular/Lymphatic: Aortic atherosclerosis. No pathologically enlarged abdominal or pelvic lymph nodes. Reproductive: Status post hysterectomy. No adnexal masses. Other: No significant abdominopelvic free fluid. Musculoskeletal: Multilevel degenerative changes spine. Grade 1 degenerative L4 on L5 anterolisthesis. Degenerative change of the hips. IMPRESSION: 1. Left hydroureteronephrosis to a 3 mm stone at the UVJ or layering dependently in the urinary bladder. 2. Small hiatal hernia. 3. Left-sided colonic diverticulosis without findings of acute diverticulitis. 4.  Aortic Atherosclerosis (ICD10-I70.0). Electronically Signed   By: Dahlia Bailiff M.D.   On: 03/30/2022 14:42    Procedures Procedures  {Document cardiac monitor, telemetry assessment procedure when appropriate:1}  Medications Ordered in ED Medications - No data to display  ED Course/ Medical Decision Making/ A&P                           Medical Decision Making Amount and/or  Complexity of Data Reviewed Labs: ordered. Radiology: ordered.  This patient complains of ***; this involves an extensive number of treatment Options and is a complaint that carries with it a high risk of complications and morbidity. The differential includes ***  I ordered, reviewed and interpreted labs, which included *** I ordered medication *** and reviewed PMP when indicated. I ordered imaging studies which included *** and I independently    visualized and interpreted imaging which showed *** Additional history obtained from *** Previous records obtained and reviewed *** I consulted *** and discussed lab and imaging findings and discussed disposition.  Cardiac monitoring reviewed, *** Social determinants considered, *** Critical Interventions: ***  After the interventions stated above, I reevaluated the patient and found *** Admission and further testing considered, ***    {Document critical care time when appropriate:1} {Document review of labs and clinical decision tools ie heart score, Chads2Vasc2 etc:1}  {Document your independent review of radiology images, and any outside records:1} {Document your discussion with family members, caretakers, and with consultants:1} {Document social determinants of health affecting pt's care:1} {Document your decision making why or why not admission, treatments were needed:1} Final Clinical Impression(s) / ED Diagnoses Final diagnoses:  None    Rx / DC Orders ED Discharge Orders     None

## 2022-03-30 NOTE — Discharge Instructions (Signed)
He was seen in the emergency department for left lower quadrant abdominal pain.  You had a CAT scan that showed a 3 mm stone in the tube going from the kidney to the bladder or possibly already in the bladder.  This size should pass on its own.  Please drink plenty of fluids.  Return to the emergency department if any high fevers or uncontrolled pain.  We are prescribing you some narcotic pain medicine to use as needed for pain.  Be aware this may make you nauseous dizzy and constipated use caution.  Follow-up with your doctor and [facility].

## 2022-03-30 NOTE — ED Triage Notes (Signed)
Was in grocery line and started having left flank pain radiating to right groin  Pain worsening over time  H/o kidney stone on same side   400mg  Ibuprofen 20 min pta

## 2022-03-30 NOTE — ED Notes (Signed)
Pt unable to urinate at this time, given spec cup while waiting °

## 2022-04-14 DIAGNOSIS — N201 Calculus of ureter: Secondary | ICD-10-CM | POA: Diagnosis not present

## 2022-04-14 DIAGNOSIS — N132 Hydronephrosis with renal and ureteral calculous obstruction: Secondary | ICD-10-CM | POA: Diagnosis not present

## 2022-04-21 DIAGNOSIS — Z23 Encounter for immunization: Secondary | ICD-10-CM | POA: Diagnosis not present

## 2022-05-04 DIAGNOSIS — G43109 Migraine with aura, not intractable, without status migrainosus: Secondary | ICD-10-CM | POA: Diagnosis not present

## 2022-05-04 DIAGNOSIS — H2513 Age-related nuclear cataract, bilateral: Secondary | ICD-10-CM | POA: Diagnosis not present

## 2022-05-04 DIAGNOSIS — H35443 Age-related reticular degeneration of retina, bilateral: Secondary | ICD-10-CM | POA: Diagnosis not present

## 2022-05-04 DIAGNOSIS — H524 Presbyopia: Secondary | ICD-10-CM | POA: Diagnosis not present

## 2022-05-04 DIAGNOSIS — H43813 Vitreous degeneration, bilateral: Secondary | ICD-10-CM | POA: Diagnosis not present

## 2022-05-04 DIAGNOSIS — H16223 Keratoconjunctivitis sicca, not specified as Sjogren's, bilateral: Secondary | ICD-10-CM | POA: Diagnosis not present

## 2022-05-31 DIAGNOSIS — M722 Plantar fascial fibromatosis: Secondary | ICD-10-CM | POA: Diagnosis not present

## 2022-05-31 DIAGNOSIS — J45909 Unspecified asthma, uncomplicated: Secondary | ICD-10-CM | POA: Diagnosis not present

## 2022-05-31 DIAGNOSIS — I700 Atherosclerosis of aorta: Secondary | ICD-10-CM | POA: Diagnosis not present

## 2022-05-31 DIAGNOSIS — M810 Age-related osteoporosis without current pathological fracture: Secondary | ICD-10-CM | POA: Diagnosis not present

## 2022-05-31 DIAGNOSIS — F419 Anxiety disorder, unspecified: Secondary | ICD-10-CM | POA: Diagnosis not present

## 2022-05-31 DIAGNOSIS — E782 Mixed hyperlipidemia: Secondary | ICD-10-CM | POA: Diagnosis not present

## 2022-05-31 DIAGNOSIS — Z23 Encounter for immunization: Secondary | ICD-10-CM | POA: Diagnosis not present

## 2022-05-31 DIAGNOSIS — I10 Essential (primary) hypertension: Secondary | ICD-10-CM | POA: Diagnosis not present

## 2022-05-31 DIAGNOSIS — J309 Allergic rhinitis, unspecified: Secondary | ICD-10-CM | POA: Diagnosis not present

## 2022-06-02 DIAGNOSIS — Z1231 Encounter for screening mammogram for malignant neoplasm of breast: Secondary | ICD-10-CM | POA: Diagnosis not present

## 2022-08-04 DIAGNOSIS — M79671 Pain in right foot: Secondary | ICD-10-CM | POA: Diagnosis not present

## 2022-08-30 DIAGNOSIS — M79671 Pain in right foot: Secondary | ICD-10-CM | POA: Diagnosis not present

## 2022-12-28 DIAGNOSIS — J40 Bronchitis, not specified as acute or chronic: Secondary | ICD-10-CM | POA: Diagnosis not present

## 2022-12-28 DIAGNOSIS — I10 Essential (primary) hypertension: Secondary | ICD-10-CM | POA: Diagnosis not present

## 2022-12-28 DIAGNOSIS — J45909 Unspecified asthma, uncomplicated: Secondary | ICD-10-CM | POA: Diagnosis not present

## 2023-01-03 DIAGNOSIS — H608X3 Other otitis externa, bilateral: Secondary | ICD-10-CM | POA: Diagnosis not present

## 2023-01-03 DIAGNOSIS — H6123 Impacted cerumen, bilateral: Secondary | ICD-10-CM | POA: Diagnosis not present

## 2023-01-03 DIAGNOSIS — H903 Sensorineural hearing loss, bilateral: Secondary | ICD-10-CM | POA: Diagnosis not present

## 2023-03-14 ENCOUNTER — Other Ambulatory Visit: Payer: Self-pay | Admitting: Internal Medicine

## 2023-03-14 ENCOUNTER — Ambulatory Visit
Admission: RE | Admit: 2023-03-14 | Discharge: 2023-03-14 | Disposition: A | Discharge status: Home / Self Care | Payer: Medicare Other | Source: Ambulatory Visit | Attending: Internal Medicine | Admitting: Internal Medicine

## 2023-03-14 DIAGNOSIS — R7309 Other abnormal glucose: Secondary | ICD-10-CM | POA: Diagnosis not present

## 2023-03-14 DIAGNOSIS — M810 Age-related osteoporosis without current pathological fracture: Secondary | ICD-10-CM | POA: Diagnosis not present

## 2023-03-14 DIAGNOSIS — I700 Atherosclerosis of aorta: Secondary | ICD-10-CM | POA: Diagnosis not present

## 2023-03-14 DIAGNOSIS — G459 Transient cerebral ischemic attack, unspecified: Secondary | ICD-10-CM | POA: Diagnosis not present

## 2023-03-14 DIAGNOSIS — I10 Essential (primary) hypertension: Secondary | ICD-10-CM | POA: Diagnosis not present

## 2023-03-14 DIAGNOSIS — M549 Dorsalgia, unspecified: Secondary | ICD-10-CM

## 2023-03-14 DIAGNOSIS — G43909 Migraine, unspecified, not intractable, without status migrainosus: Secondary | ICD-10-CM | POA: Diagnosis not present

## 2023-03-14 DIAGNOSIS — R21 Rash and other nonspecific skin eruption: Secondary | ICD-10-CM | POA: Diagnosis not present

## 2023-03-14 DIAGNOSIS — J45909 Unspecified asthma, uncomplicated: Secondary | ICD-10-CM | POA: Diagnosis not present

## 2023-03-14 DIAGNOSIS — M545 Low back pain, unspecified: Secondary | ICD-10-CM | POA: Diagnosis not present

## 2023-03-14 DIAGNOSIS — Z1331 Encounter for screening for depression: Secondary | ICD-10-CM | POA: Diagnosis not present

## 2023-03-14 DIAGNOSIS — Z0000 Encounter for general adult medical examination without abnormal findings: Secondary | ICD-10-CM | POA: Diagnosis not present

## 2023-03-14 DIAGNOSIS — E782 Mixed hyperlipidemia: Secondary | ICD-10-CM | POA: Diagnosis not present

## 2023-03-18 DIAGNOSIS — I6523 Occlusion and stenosis of bilateral carotid arteries: Secondary | ICD-10-CM | POA: Diagnosis not present

## 2023-03-18 DIAGNOSIS — G459 Transient cerebral ischemic attack, unspecified: Secondary | ICD-10-CM | POA: Diagnosis not present

## 2023-04-06 DIAGNOSIS — M5451 Vertebrogenic low back pain: Secondary | ICD-10-CM | POA: Diagnosis not present

## 2023-04-13 DIAGNOSIS — M5451 Vertebrogenic low back pain: Secondary | ICD-10-CM | POA: Diagnosis not present

## 2023-04-20 DIAGNOSIS — M5451 Vertebrogenic low back pain: Secondary | ICD-10-CM | POA: Diagnosis not present

## 2023-04-28 DIAGNOSIS — M5451 Vertebrogenic low back pain: Secondary | ICD-10-CM | POA: Diagnosis not present

## 2023-04-30 DIAGNOSIS — Z23 Encounter for immunization: Secondary | ICD-10-CM | POA: Diagnosis not present

## 2023-06-07 DIAGNOSIS — Z01419 Encounter for gynecological examination (general) (routine) without abnormal findings: Secondary | ICD-10-CM | POA: Diagnosis not present

## 2023-06-07 DIAGNOSIS — Z1231 Encounter for screening mammogram for malignant neoplasm of breast: Secondary | ICD-10-CM | POA: Diagnosis not present

## 2023-06-15 ENCOUNTER — Other Ambulatory Visit (HOSPITAL_COMMUNITY): Payer: Self-pay | Admitting: Internal Medicine

## 2023-06-15 DIAGNOSIS — E782 Mixed hyperlipidemia: Secondary | ICD-10-CM

## 2023-06-15 DIAGNOSIS — I10 Essential (primary) hypertension: Secondary | ICD-10-CM | POA: Diagnosis not present

## 2023-06-15 DIAGNOSIS — M79602 Pain in left arm: Secondary | ICD-10-CM | POA: Diagnosis not present

## 2023-06-21 ENCOUNTER — Ambulatory Visit (HOSPITAL_COMMUNITY)
Admission: RE | Admit: 2023-06-21 | Discharge: 2023-06-21 | Disposition: A | Discharge status: Home / Self Care | Payer: Medicare Other | Source: Ambulatory Visit | Attending: Internal Medicine | Admitting: Internal Medicine

## 2023-06-21 DIAGNOSIS — E782 Mixed hyperlipidemia: Secondary | ICD-10-CM | POA: Insufficient documentation

## 2023-06-30 DIAGNOSIS — H1013 Acute atopic conjunctivitis, bilateral: Secondary | ICD-10-CM | POA: Diagnosis not present

## 2023-06-30 DIAGNOSIS — H43813 Vitreous degeneration, bilateral: Secondary | ICD-10-CM | POA: Diagnosis not present

## 2023-06-30 DIAGNOSIS — G43109 Migraine with aura, not intractable, without status migrainosus: Secondary | ICD-10-CM | POA: Diagnosis not present

## 2023-06-30 DIAGNOSIS — H2513 Age-related nuclear cataract, bilateral: Secondary | ICD-10-CM | POA: Diagnosis not present

## 2023-06-30 DIAGNOSIS — H35443 Age-related reticular degeneration of retina, bilateral: Secondary | ICD-10-CM | POA: Diagnosis not present

## 2023-06-30 DIAGNOSIS — H16223 Keratoconjunctivitis sicca, not specified as Sjogren's, bilateral: Secondary | ICD-10-CM | POA: Diagnosis not present

## 2023-07-19 ENCOUNTER — Other Ambulatory Visit: Payer: Self-pay | Admitting: Internal Medicine

## 2023-07-19 ENCOUNTER — Ambulatory Visit
Admission: RE | Admit: 2023-07-19 | Discharge: 2023-07-19 | Disposition: A | Discharge status: Home / Self Care | Payer: Medicare Other | Source: Ambulatory Visit | Attending: Internal Medicine | Admitting: Internal Medicine

## 2023-07-19 DIAGNOSIS — J189 Pneumonia, unspecified organism: Secondary | ICD-10-CM | POA: Diagnosis not present

## 2023-07-19 DIAGNOSIS — J40 Bronchitis, not specified as acute or chronic: Secondary | ICD-10-CM

## 2023-07-20 ENCOUNTER — Institutional Professional Consult (permissible substitution) (HOSPITAL_BASED_OUTPATIENT_CLINIC_OR_DEPARTMENT_OTHER): Payer: Medicare Other | Admitting: Nurse Practitioner

## 2023-07-22 ENCOUNTER — Encounter (HOSPITAL_COMMUNITY)
Admission: EM | Disposition: A | Discharge status: Home Health | Payer: Self-pay | Source: Home / Self Care | Attending: Cardiology

## 2023-07-22 ENCOUNTER — Inpatient Hospital Stay (HOSPITAL_COMMUNITY)
Admission: EM | Admit: 2023-07-22 | Discharge: 2023-07-27 | DRG: 321 | Disposition: A | Discharge status: Home Health | Payer: Medicare Other | Attending: Cardiology | Admitting: Cardiology

## 2023-07-22 DIAGNOSIS — E7800 Pure hypercholesterolemia, unspecified: Secondary | ICD-10-CM | POA: Diagnosis present

## 2023-07-22 DIAGNOSIS — K219 Gastro-esophageal reflux disease without esophagitis: Secondary | ICD-10-CM | POA: Diagnosis not present

## 2023-07-22 DIAGNOSIS — Z7902 Long term (current) use of antithrombotics/antiplatelets: Secondary | ICD-10-CM

## 2023-07-22 DIAGNOSIS — Z7982 Long term (current) use of aspirin: Secondary | ICD-10-CM

## 2023-07-22 DIAGNOSIS — Z888 Allergy status to other drugs, medicaments and biological substances status: Secondary | ICD-10-CM | POA: Diagnosis not present

## 2023-07-22 DIAGNOSIS — I213 ST elevation (STEMI) myocardial infarction of unspecified site: Principal | ICD-10-CM

## 2023-07-22 DIAGNOSIS — Z882 Allergy status to sulfonamides status: Secondary | ICD-10-CM

## 2023-07-22 DIAGNOSIS — Z885 Allergy status to narcotic agent status: Secondary | ICD-10-CM

## 2023-07-22 DIAGNOSIS — Z8249 Family history of ischemic heart disease and other diseases of the circulatory system: Secondary | ICD-10-CM

## 2023-07-22 DIAGNOSIS — R079 Chest pain, unspecified: Secondary | ICD-10-CM | POA: Diagnosis not present

## 2023-07-22 DIAGNOSIS — Z881 Allergy status to other antibiotic agents status: Secondary | ICD-10-CM | POA: Diagnosis not present

## 2023-07-22 DIAGNOSIS — Z886 Allergy status to analgesic agent status: Secondary | ICD-10-CM | POA: Diagnosis not present

## 2023-07-22 DIAGNOSIS — I110 Hypertensive heart disease with heart failure: Secondary | ICD-10-CM | POA: Diagnosis not present

## 2023-07-22 DIAGNOSIS — Z79899 Other long term (current) drug therapy: Secondary | ICD-10-CM | POA: Diagnosis not present

## 2023-07-22 DIAGNOSIS — D649 Anemia, unspecified: Secondary | ICD-10-CM | POA: Diagnosis present

## 2023-07-22 DIAGNOSIS — I2109 ST elevation (STEMI) myocardial infarction involving other coronary artery of anterior wall: Principal | ICD-10-CM | POA: Diagnosis present

## 2023-07-22 DIAGNOSIS — I10 Essential (primary) hypertension: Secondary | ICD-10-CM | POA: Diagnosis not present

## 2023-07-22 DIAGNOSIS — E785 Hyperlipidemia, unspecified: Secondary | ICD-10-CM | POA: Diagnosis present

## 2023-07-22 DIAGNOSIS — Z955 Presence of coronary angioplasty implant and graft: Secondary | ICD-10-CM

## 2023-07-22 DIAGNOSIS — R7401 Elevation of levels of liver transaminase levels: Secondary | ICD-10-CM | POA: Diagnosis not present

## 2023-07-22 DIAGNOSIS — I255 Ischemic cardiomyopathy: Secondary | ICD-10-CM | POA: Diagnosis not present

## 2023-07-22 DIAGNOSIS — Z880 Allergy status to penicillin: Secondary | ICD-10-CM | POA: Diagnosis not present

## 2023-07-22 DIAGNOSIS — Z8261 Family history of arthritis: Secondary | ICD-10-CM | POA: Diagnosis not present

## 2023-07-22 DIAGNOSIS — I7300 Raynaud's syndrome without gangrene: Secondary | ICD-10-CM | POA: Diagnosis not present

## 2023-07-22 DIAGNOSIS — I2510 Atherosclerotic heart disease of native coronary artery without angina pectoris: Secondary | ICD-10-CM | POA: Diagnosis present

## 2023-07-22 DIAGNOSIS — Z806 Family history of leukemia: Secondary | ICD-10-CM | POA: Diagnosis not present

## 2023-07-22 DIAGNOSIS — I2102 ST elevation (STEMI) myocardial infarction involving left anterior descending coronary artery: Principal | ICD-10-CM | POA: Diagnosis present

## 2023-07-22 DIAGNOSIS — I5021 Acute systolic (congestive) heart failure: Secondary | ICD-10-CM | POA: Diagnosis present

## 2023-07-22 DIAGNOSIS — J189 Pneumonia, unspecified organism: Secondary | ICD-10-CM | POA: Diagnosis present

## 2023-07-22 DIAGNOSIS — R7989 Other specified abnormal findings of blood chemistry: Secondary | ICD-10-CM | POA: Insufficient documentation

## 2023-07-22 DIAGNOSIS — R918 Other nonspecific abnormal finding of lung field: Secondary | ICD-10-CM | POA: Diagnosis not present

## 2023-07-22 DIAGNOSIS — Z8489 Family history of other specified conditions: Secondary | ICD-10-CM

## 2023-07-22 HISTORY — PX: CORONARY STENT INTERVENTION: CATH118234

## 2023-07-22 HISTORY — PX: LEFT HEART CATH AND CORONARY ANGIOGRAPHY: CATH118249

## 2023-07-22 HISTORY — PX: CORONARY/GRAFT ACUTE MI REVASCULARIZATION: CATH118305

## 2023-07-22 LAB — COMPREHENSIVE METABOLIC PANEL
ALT: 99 U/L — ABNORMAL HIGH (ref 0–44)
AST: 144 U/L — ABNORMAL HIGH (ref 15–41)
Albumin: 3.1 g/dL — ABNORMAL LOW (ref 3.5–5.0)
Alkaline Phosphatase: 137 U/L — ABNORMAL HIGH (ref 38–126)
Anion gap: 13 (ref 5–15)
BUN: 24 mg/dL — ABNORMAL HIGH (ref 8–23)
CO2: 19 mmol/L — ABNORMAL LOW (ref 22–32)
Calcium: 8.8 mg/dL — ABNORMAL LOW (ref 8.9–10.3)
Chloride: 103 mmol/L (ref 98–111)
Creatinine, Ser: 0.87 mg/dL (ref 0.44–1.00)
GFR, Estimated: >60 mL/min (ref >60)
Glucose, Bld: 144 mg/dL — ABNORMAL HIGH (ref 70–99)
Potassium: 3.4 mmol/L — ABNORMAL LOW (ref 3.5–5.1)
Sodium: 135 mmol/L (ref 135–145)
Total Bilirubin: 0.4 mg/dL (ref <1.2)
Total Protein: 6.4 g/dL — ABNORMAL LOW (ref 6.5–8.1)

## 2023-07-22 LAB — PROTIME-INR
INR: 1.2 (ref 0.8–1.2)
Prothrombin Time: 15.7 s — ABNORMAL HIGH (ref 11.4–15.2)

## 2023-07-22 LAB — CBC
HCT: 34.4 % — ABNORMAL LOW (ref 36.0–46.0)
Hemoglobin: 11.5 g/dL — ABNORMAL LOW (ref 12.0–15.0)
MCH: 29 pg (ref 26.0–34.0)
MCHC: 33.4 g/dL (ref 30.0–36.0)
MCV: 86.6 fL (ref 80.0–100.0)
Platelets: 398 10*3/uL (ref 150–400)
RBC: 3.97 MIL/uL (ref 3.87–5.11)
RDW: 12.2 % (ref 11.5–15.5)
WBC: 15.7 10*3/uL — ABNORMAL HIGH (ref 4.0–10.5)
nRBC: 0 % (ref 0.0–0.2)

## 2023-07-22 LAB — POCT I-STAT, CHEM 8
BUN: 24 mg/dL — ABNORMAL HIGH (ref 8–23)
Calcium, Ion: 1.19 mmol/L (ref 1.15–1.40)
Chloride: 103 mmol/L (ref 98–111)
Creatinine, Ser: 0.8 mg/dL (ref 0.44–1.00)
Glucose, Bld: 149 mg/dL — ABNORMAL HIGH (ref 70–99)
HCT: 32 % — ABNORMAL LOW (ref 36.0–46.0)
Hemoglobin: 10.9 g/dL — ABNORMAL LOW (ref 12.0–15.0)
Potassium: 3.4 mmol/L — ABNORMAL LOW (ref 3.5–5.1)
Sodium: 136 mmol/L (ref 135–145)
TCO2: 22 mmol/L (ref 22–32)

## 2023-07-22 LAB — LIPID PANEL
Cholesterol: 154 mg/dL (ref 0–200)
HDL: 39 mg/dL — ABNORMAL LOW (ref >40)
LDL Cholesterol: 98 mg/dL (ref 0–99)
Total CHOL/HDL Ratio: 3.9 {ratio}
Triglycerides: 86 mg/dL (ref <150)
VLDL: 17 mg/dL (ref 0–40)

## 2023-07-22 LAB — HEMOGLOBIN A1C
Hgb A1c MFr Bld: 5.4 % (ref 4.8–5.6)
Mean Plasma Glucose: 108.28 mg/dL

## 2023-07-22 LAB — POCT ACTIVATED CLOTTING TIME
Activated Clotting Time: 251 s
Activated Clotting Time: 245 s

## 2023-07-22 LAB — CG4 I-STAT (LACTIC ACID)
Lactic Acid, Venous: 3.1 mmol/L (ref 0.5–1.9)
Lactic Acid, Venous: 2.2 mmol/L (ref 0.5–1.9)

## 2023-07-22 LAB — APTT: aPTT: >200 s (ref 24–36)

## 2023-07-22 LAB — TROPONIN I (HIGH SENSITIVITY): Troponin I (High Sensitivity): >24000 ng/L (ref <18)

## 2023-07-22 LAB — MRSA NEXT GEN BY PCR, NASAL: MRSA by PCR Next Gen: NOT DETECTED

## 2023-07-22 SURGERY — LEFT HEART CATH AND CORONARY ANGIOGRAPHY
Anesthesia: LOCAL

## 2023-07-22 MED ORDER — ACETAMINOPHEN 325 MG PO TABS
650.0000 mg | ORAL_TABLET | ORAL | Status: DC | PRN
  Administered 2023-07-22: 650 mg via ORAL
  Filled 2023-07-22 (×2): qty 2

## 2023-07-22 MED ORDER — HEPARIN SODIUM (PORCINE) 1000 UNIT/ML IJ SOLN
INTRAMUSCULAR | Status: DC | PRN
  Administered 2023-07-22: 3000 [IU] via INTRAVENOUS
  Administered 2023-07-22: 2000 [IU] via INTRAVENOUS
  Administered 2023-07-22 (×2): 4000 [IU] via INTRAVENOUS

## 2023-07-22 MED ORDER — SODIUM CHLORIDE 0.9 % IV SOLN: 250.0000 mL | INTRAVENOUS | Status: AC | PRN

## 2023-07-22 MED ORDER — ATORVASTATIN CALCIUM 80 MG PO TABS
80.0000 mg | ORAL_TABLET | Freq: Every day | ORAL | Status: DC
  Administered 2023-07-22 – 2023-07-27 (×6): 80 mg via ORAL
  Filled 2023-07-22 (×6): qty 1

## 2023-07-22 MED ORDER — ONDANSETRON HCL 4 MG/2ML IJ SOLN
4.0000 mg | Freq: Four times a day (QID) | INTRAMUSCULAR | Status: DC | PRN
  Administered 2023-07-22: 4 mg via INTRAVENOUS
  Filled 2023-07-22 (×3): qty 2

## 2023-07-22 MED ORDER — SODIUM CHLORIDE 0.9 % IV SOLN
INTRAVENOUS | Status: AC | PRN
  Administered 2023-07-22: 80 mL/h via INTRAVENOUS

## 2023-07-22 MED ORDER — HEPARIN SODIUM (PORCINE) 5000 UNIT/ML IJ SOLN: 4000.0000 [IU] | Freq: Once | INTRAMUSCULAR | Status: DC

## 2023-07-22 MED ORDER — SODIUM CHLORIDE 0.9% FLUSH
3.0000 mL | Freq: Two times a day (BID) | INTRAVENOUS | Status: DC
  Administered 2023-07-22 – 2023-07-27 (×10): 3 mL via INTRAVENOUS

## 2023-07-22 MED ORDER — NITROGLYCERIN 1 MG/10 ML FOR IR/CATH LAB
INTRA_ARTERIAL | Status: AC
  Filled 2023-07-22: qty 10

## 2023-07-22 MED ORDER — FENTANYL CITRATE (PF) 100 MCG/2ML IJ SOLN
INTRAMUSCULAR | Status: AC
  Filled 2023-07-22: qty 2

## 2023-07-22 MED ORDER — MIDAZOLAM HCL 2 MG/2ML IJ SOLN
INTRAMUSCULAR | Status: DC | PRN
  Administered 2023-07-22: 1 mg via INTRAVENOUS

## 2023-07-22 MED ORDER — VERAPAMIL HCL 2.5 MG/ML IV SOLN
INTRAVENOUS | Status: AC
Start: 2023-07-22 — End: ?
  Filled 2023-07-22: qty 2

## 2023-07-22 MED ORDER — ONDANSETRON HCL 4 MG/2ML IJ SOLN
INTRAMUSCULAR | Status: DC | PRN
  Administered 2023-07-22: 4 mg via INTRAVENOUS

## 2023-07-22 MED ORDER — ONDANSETRON HCL 4 MG/2ML IJ SOLN
INTRAMUSCULAR | Status: AC
  Filled 2023-07-22: qty 2

## 2023-07-22 MED ORDER — VERAPAMIL HCL 2.5 MG/ML IV SOLN
INTRAVENOUS | Status: DC | PRN
  Administered 2023-07-22: 10 mL via INTRA_ARTERIAL

## 2023-07-22 MED ORDER — NITROGLYCERIN 1 MG/10 ML FOR IR/CATH LAB
INTRA_ARTERIAL | Status: DC | PRN
  Administered 2023-07-22 (×2): 200 ug via INTRACORONARY

## 2023-07-22 MED ORDER — METOPROLOL SUCCINATE ER 25 MG PO TB24
25.0000 mg | ORAL_TABLET | Freq: Every day | ORAL | Status: DC
  Administered 2023-07-22 – 2023-07-27 (×6): 25 mg via ORAL
  Filled 2023-07-22 (×6): qty 1

## 2023-07-22 MED ORDER — TICAGRELOR 90 MG PO TABS
90.0000 mg | ORAL_TABLET | Freq: Two times a day (BID) | ORAL | Status: DC
  Administered 2023-07-23 – 2023-07-27 (×9): 90 mg via ORAL
  Filled 2023-07-22 (×10): qty 1

## 2023-07-22 MED ORDER — TICAGRELOR 90 MG PO TABS
ORAL_TABLET | ORAL | Status: AC
  Filled 2023-07-22: qty 2

## 2023-07-22 MED ORDER — LIDOCAINE HCL (PF) 1 % IJ SOLN
INTRAMUSCULAR | Status: DC | PRN
  Administered 2023-07-22: 2 mL

## 2023-07-22 MED ORDER — FENTANYL CITRATE (PF) 100 MCG/2ML IJ SOLN
INTRAMUSCULAR | Status: DC | PRN
  Administered 2023-07-22: 25 ug via INTRAVENOUS

## 2023-07-22 MED ORDER — SODIUM CHLORIDE 0.9 % IV SOLN: INTRAVENOUS | Status: AC

## 2023-07-22 MED ORDER — IOHEXOL 350 MG/ML SOLN
INTRAVENOUS | Status: DC | PRN
  Administered 2023-07-22: 110 mL

## 2023-07-22 MED ORDER — POTASSIUM CHLORIDE 20 MEQ PO PACK
40.0000 meq | PACK | Freq: Once | ORAL | Status: AC
  Administered 2023-07-22: 40 meq via ORAL
  Filled 2023-07-22: qty 2

## 2023-07-22 MED ORDER — SODIUM CHLORIDE 0.9% FLUSH: 3.0000 mL | INTRAVENOUS | Status: DC | PRN

## 2023-07-22 MED ORDER — LOSARTAN POTASSIUM 50 MG PO TABS
25.0000 mg | ORAL_TABLET | Freq: Every day | ORAL | Status: DC
Start: 2023-07-22 — End: 2023-07-27
  Administered 2023-07-22 – 2023-07-27 (×5): 25 mg via ORAL
  Filled 2023-07-22 (×6): qty 1

## 2023-07-22 MED ORDER — MIDAZOLAM HCL 2 MG/2ML IJ SOLN
INTRAMUSCULAR | Status: AC
  Filled 2023-07-22: qty 2

## 2023-07-22 MED ORDER — LIDOCAINE HCL (PF) 1 % IJ SOLN
INTRAMUSCULAR | Status: AC
  Filled 2023-07-22: qty 30

## 2023-07-22 MED ORDER — HEPARIN (PORCINE) IN NACL 1000-0.9 UT/500ML-% IV SOLN
INTRAVENOUS | Status: DC | PRN
  Administered 2023-07-22 (×2): 500 mL

## 2023-07-22 MED ORDER — HEPARIN SODIUM (PORCINE) 1000 UNIT/ML IJ SOLN
INTRAMUSCULAR | Status: AC
  Filled 2023-07-22: qty 10

## 2023-07-22 SURGICAL SUPPLY — 21 items
BALLN SAPPHIRE 2.5X20 (BALLOONS) ×1
BALLOON SAPPHIRE 2.5X20 (BALLOONS) IMPLANT
CATH INFINITI AMBI 5FR TG (CATHETERS) IMPLANT
CATH INFINITI JR4 5F (CATHETERS) IMPLANT
CATH LAUNCHER 6FR EBU3.5 (CATHETERS) IMPLANT
CATH VISTA GUIDE 6FR XB3 (CATHETERS) IMPLANT
DEVICE RAD COMP TR BAND LRG (VASCULAR PRODUCTS) IMPLANT
ELECT DEFIB PAD ADLT CADENCE (PAD) IMPLANT
GLIDESHEATH SLEND A-KIT 6F 22G (SHEATH) IMPLANT
GUIDEWIRE INQWIRE 1.5J.035X260 (WIRE) IMPLANT
INQWIRE 1.5J .035X260CM (WIRE) ×1
KIT ENCORE 26 ADVANTAGE (KITS) IMPLANT
KIT SYRINGE INJ CVI SPIKEX1 (MISCELLANEOUS) IMPLANT
PACK CARDIAC CATHETERIZATION (CUSTOM PROCEDURE TRAY) ×1 IMPLANT
SET ATX-X65L (MISCELLANEOUS) IMPLANT
STENT SYNERGY XD 2.50X16 (Permanent Stent) IMPLANT
STENT SYNERGY XD 2.50X38 (Permanent Stent) IMPLANT
SYNERGY XD 2.50X16 (Permanent Stent) ×1 IMPLANT
SYNERGY XD 2.50X38 (Permanent Stent) ×1 IMPLANT
TUBING CIL FLEX 10 FLL-RA (TUBING) IMPLANT
WIRE ASAHI PROWATER 180CM (WIRE) IMPLANT

## 2023-07-22 NOTE — H&P (Signed)
Cardiology Admission History and Physical   Patient ID: ZOEL PICOU MRN: [id number]; DOB: [birth year]   Admission date: 07/22/2023  PCP:  Georgann Housekeeper, MD   [facility] Providers Cardiologist:  None        Chief Complaint: Chest pain  Patient Profile:   Alyssa James is a 72 y.o. female with chest pain who is being seen 07/22/2023 for the evaluation of STEMI.  Patient with hypertension, hypercholesterolemia, coronary calcium score in the 88th percentile 2 years ago presenting via EMS, patient had severe chest pain associated with radiation to the back and also left arm yesterday that lasted for cased 30 minutes to an hour and was lingering for longer but mild, patient ignored this.  She eventually slept felt better.  This morning again started having chest pain.  This afternoon pain got worse and she activated the EMS, EKG reviewed ST elevation however no Q waves noted V1-V3 suggestive of completed infarct with deep T wave inversion.  With still ongoing chest pain, with activation of STEMI she was emergently will be taken to the cardiac catheterization lab.  Past Medical History:  Diagnosis Date   Anemia    Anxiety    Cataract    small   Complication of anesthesia    " TAKES ME AWHILE TO WAKE UP"   GERD (gastroesophageal reflux disease)    History of kidney stones    Hyperlipidemia    Hypertension    Nausea    related to kidney stone   Raynaud disease    Small vessel disease (HCC)    Snake bite 12/30/2013   Wears glasses    Wears partial dentures     Past Surgical History:  Procedure Laterality Date   ABDOMINAL HYSTERECTOMY     BILATERAL TEMPOROMANDIBULAR JOINT ARTHROPLASTY     KNEE ARTHROSCOPY     SHOULDER ARTHROSCOPY Right 2017   ULNAR NERVE REPAIR Left      Medications Prior to Admission: Prior to Admission medications   Medication Sig Start Date End Date Taking? Authorizing Provider  albuterol (PROVENTIL HFA;VENTOLIN HFA) 108 (90 BASE) MCG/ACT  inhaler Inhale 2 puffs into the lungs every 6 (six) hours as needed for wheezing or shortness of breath.    [provider]  aspirin 81 MG chewable tablet Chew 81 mg by mouth daily.     [provider]  Calcium-Phosphorus-Vitamin D (CALCIUM GUMMIES PO) Take 2 tablets by mouth daily.    [provider]  Cholecalciferol (VITAMIN D3 ADULT GUMMIES PO) Take 2 tablets by mouth daily.     [provider]  HYDROcodone-acetaminophen (NORCO/VICODIN) 5-325 MG per tablet Take 1 tablet by mouth every 6 (six) hours as needed for moderate pain. Patient not taking: Reported on 07/23/2017 01/01/14   Georgann Housekeeper, MD  HYDROcodone-acetaminophen (NORCO/VICODIN) 5-325 MG tablet Take 1-2 tablets by mouth every 4 (four) hours as needed. 07/23/17   Lorre Nick, MD  ibuprofen (ADVIL) 800 MG tablet Take 1 tablet (800 mg total) by mouth every 8 (eight) hours as needed. 03/30/22   Terrilee Files, MD  Multiple Vitamins-Minerals (ADULT GUMMY PO) Take 2 tablets by mouth daily.    [provider]  Omega-3 Fatty Acids (FISH OIL ADULT GUMMIES PO) Take 2 tablets by mouth daily.    [provider]  ondansetron (ZOFRAN ODT) 8 MG disintegrating tablet Take 1 tablet (8 mg total) by mouth every 8 (eight) hours as needed for nausea or vomiting. 07/23/17   Lorre Nick,  MD  oxyCODONE-acetaminophen (PERCOCET/ROXICET) 5-325 MG tablet Take 1 tablet by mouth every 6 (six) hours as needed for severe pain. 03/30/22   Terrilee Files, MD  tamsulosin (FLOMAX) 0.4 MG CAPS capsule Take 1 capsule (0.4 mg total) by mouth daily. 07/23/17   Lorre Nick, MD  TRIAMCINOLONE ACETONIDE EX Apply 1 application topically daily as needed (rash).    [provider]     Allergies:    Allergies  Allergen Reactions   Aspirin Other (See Comments)    Pt can only take low dose aspirin   Codeine Other (See Comments)    Headache   Sulfa Antibiotics Nausea And Vomiting   Tetracyclines & Related  Nausea And Vomiting and Other (See Comments)    Headache   Tizanidine Nausea And Vomiting   Penicillins Hives    Has patient had a PCN reaction causing immediate rash, facial/tongue/throat swelling, SOB or lightheadedness with hypotension: Yes Has patient had a PCN reaction causing severe rash involving mucus membranes or skin necrosis: No Has patient had a PCN reaction that required hospitalization: No Has patient had a PCN reaction occurring within the last 10 years: No If all of the above answers are "NO", then may proceed with Cephalosporin use.    Social History:   Social History   Socioeconomic History   Marital status: Single    Spouse name: Not on file   Number of children: Not on file   Years of education: Not on file   Highest education level: Not on file  Occupational History   Not on file  Tobacco Use   Smoking status: Never   Smokeless tobacco: Never  Vaping Use   Vaping status: Never Used  Substance and Sexual Activity   Alcohol use: Yes    Comment: rare   Drug use: No   Sexual activity: Not on file  Other Topics Concern   Not on file  Social History Narrative   Not on file   Social Determinants of Health   Financial Resource Strain: Not on file  Food Insecurity: Not on file  Transportation Needs: Not on file  Physical Activity: Not on file  Stress: Not on file  Social Connections: Not on file  Intimate Partner Violence: Not on file    Family History:   The patient's family history includes Arthritis in her mother; Heart attack in her brother and brother; Leukemia in her mother; Scoliosis in her mother.    ROS:  Chest pain.  Positive.  Negative for nausea, vomiting, dyspnea.  Physical Exam/Data:   Vitals:   07/22/23 1647 07/22/23 1652 07/22/23 1657 07/22/23 1702  BP: 136/74 130/75 122/76 135/70  Pulse: 68 68 70 66  Resp: (!) 24 19 (!) 23 19  SpO2: 97% 98% 91% (!) 82%  Weight:      Height:       No intake or output data in the 24 hours  ending 07/22/23 1711    07/22/2023    3:58 PM 03/30/2022    2:03 PM 07/29/2017    5:07 PM  Last 3 Weights  Weight (lbs) 125 lb 126 lb 1.7 oz 126 lb  Weight (kg) 56.7 kg 57.2 kg 57.153 kg     Body mass index is 23.62 kg/m.  General:  Well nourished, well developed, in no acute distress HEENT: normal Neck: no JVD Vascular: No carotid bruits; Distal pulses 2+ bilaterally   Cardiac:  normal S1, S2; RRR; no murmur  Lungs:  clear to auscultation bilaterally,  no wheezing, rhonchi or rales  Abd: soft, nontender, no hepatomegaly  Ext: no edema Musculoskeletal:  No deformities, BUE and BLE strength normal and equal Skin: warm and dry  Psych:  Normal affect    EKG:  The ECG that was done by EMS reveals ST elevation in the septal leads with reciprocal ST depression in the inferolateral leads at 1459 hrs.  Was personally   Relevant CV Studies: NA  Laboratory Data:  High Sensitivity Troponin:  No results for input(s): "TROPONINIHS" in the last 720 hours.    Chemistry Recent Labs  Lab 07/22/23 1607  NA 136  K 3.4*  CL 103  GLUCOSE 149*  BUN 24*  CREATININE 0.80    No results for input(s): "PROT", "ALBUMIN", "AST", "ALT", "ALKPHOS", "BILITOT" in the last 168 hours. Lipids  Recent Labs  Lab 07/22/23 1600  CHOL 154  TRIG 86  HDL 39*  LDLCALC 98  CHOLHDL 3.9   Hematology Recent Labs  Lab 07/22/23 1600 07/22/23 1607  WBC 15.7*  --   RBC 3.97  --   HGB 11.5* 10.9*  HCT 34.4* 32.0*  MCV 86.6  --   MCH 29.0  --   MCHC 33.4  --   RDW 12.2  --   PLT 398  --    Thyroid No results for input(s): "TSH", "FREET4" in the last 168 hours. BNPNo results for input(s): "BNP", "PROBNP" in the last 168 hours.  DDimer No results for input(s): "DDIMER" in the last 168 hours.   Radiology/Studies:  No results found.   Assessment and Plan:   Acute anteroseptal STEMI Primary hypertension Hypercholesterolemia   Recommendation: Will take her directly to cardiac catheterization  lab.  Will treat her risk factors including hypertension and lipids.   Risk Assessment/Risk Scores:  { Code Status: Full Code  Severity of Illness: The appropriate patient status for this patient is INPATIENT. Inpatient status is judged to be reasonable and necessary in order to provide the required intensity of service to ensure the patient's safety. The patient's presenting symptoms, physical exam findings, and initial radiographic and laboratory data in the context of their chronic comorbidities is felt to place them at high risk for further clinical deterioration. Furthermore, it is not anticipated that the patient will be medically stable for discharge from the hospital within 2 midnights of admission.   * I certify that at the point of admission it is my clinical judgment that the patient will require inpatient hospital care spanning beyond 2 midnights from the point of admission due to high intensity of service, high risk for further deterioration and high frequency of surveillance required.*   For questions or updates, please contact [facility] Please consult [email or web address] for contact info under     Signed, Yates Decamp, MD  07/22/2023 5:11 PM

## 2023-07-22 NOTE — Progress Notes (Signed)
eLink Physician-Brief Progress Note Patient Name: Alyssa James DOB: 08-17-1950 MRN: [id number]   Date of Service  07/22/2023  HPI/Events of Note  72 year old with a history of hypertension, hyperlipidemia, gastroesophageal reflux disease and anxiety who presents to the emergency department for evaluation of NSTEMI with V1-V3 ST elevations.  Taken to the Cath Lab with the proximal to mid LAD dilation status post PCI.  Vital signs are within normal limits.  Results are consistent with mild electrolyte deficiencies, elevated transaminases, troponin is above the limits, and leukocytosis.  Minimal lactate elevation.  Normocytic anemia.   eICU Interventions  Maintain dual antiplatelet therapy with ticagrelor and aspirin (not currently on profile).  Goal-directed medical therapy per cardiology.  Echocardiogram pending.  DVT prophylaxis with heparin GI prophylaxis not indicated    Intervention Category Evaluation Type: New Patient Evaluation  [name] 07/22/2023, 7:45 PM

## 2023-07-23 ENCOUNTER — Other Ambulatory Visit: Payer: Self-pay

## 2023-07-23 ENCOUNTER — Encounter (HOSPITAL_COMMUNITY): Payer: Self-pay | Admitting: Cardiology

## 2023-07-23 ENCOUNTER — Inpatient Hospital Stay (HOSPITAL_COMMUNITY): Payer: Medicare Other

## 2023-07-23 DIAGNOSIS — R079 Chest pain, unspecified: Secondary | ICD-10-CM

## 2023-07-23 DIAGNOSIS — I2109 ST elevation (STEMI) myocardial infarction involving other coronary artery of anterior wall: Secondary | ICD-10-CM

## 2023-07-23 LAB — ECHOCARDIOGRAM COMPLETE
AR max vel: 1.78 cm2
AV Area VTI: 2.11 cm2
AV Area mean vel: 1.96 cm2
AV Mean grad: 5.5 mm[Hg]
AV Peak grad: 9.7 mm[Hg]
Ao pk vel: 1.56 m/s
Area-P 1/2: 4.63 cm2
Height: 61 in
S' Lateral: 2.4 cm
Weight: 2000 [oz_av]

## 2023-07-23 LAB — BASIC METABOLIC PANEL
Anion gap: 8 (ref 5–15)
BUN: 23 mg/dL (ref 8–23)
CO2: 22 mmol/L (ref 22–32)
Calcium: 8.5 mg/dL — ABNORMAL LOW (ref 8.9–10.3)
Chloride: 105 mmol/L (ref 98–111)
Creatinine, Ser: 1.02 mg/dL — ABNORMAL HIGH (ref 0.44–1.00)
GFR, Estimated: 58 mL/min — ABNORMAL LOW (ref >60)
Glucose, Bld: 101 mg/dL — ABNORMAL HIGH (ref 70–99)
Potassium: 4.6 mmol/L (ref 3.5–5.1)
Sodium: 135 mmol/L (ref 135–145)

## 2023-07-23 LAB — CBC
HCT: 29 % — ABNORMAL LOW (ref 36.0–46.0)
Hemoglobin: 9.6 g/dL — ABNORMAL LOW (ref 12.0–15.0)
MCH: 29.4 pg (ref 26.0–34.0)
MCHC: 33.1 g/dL (ref 30.0–36.0)
MCV: 88.7 fL (ref 80.0–100.0)
Platelets: 364 10*3/uL (ref 150–400)
RBC: 3.27 MIL/uL — ABNORMAL LOW (ref 3.87–5.11)
RDW: 12.4 % (ref 11.5–15.5)
WBC: 14.1 10*3/uL — ABNORMAL HIGH (ref 4.0–10.5)
nRBC: 0 % (ref 0.0–0.2)

## 2023-07-23 MED ORDER — ALBUTEROL SULFATE (2.5 MG/3ML) 0.083% IN NEBU: 2.5000 mg | INHALATION_SOLUTION | Freq: Four times a day (QID) | RESPIRATORY_TRACT | Status: DC | PRN

## 2023-07-23 MED ORDER — ONDANSETRON 4 MG PO TBDP: 8.0000 mg | ORAL_TABLET | Freq: Three times a day (TID) | ORAL | Status: DC | PRN

## 2023-07-23 MED ORDER — ASPIRIN 81 MG PO CHEW
81.0000 mg | CHEWABLE_TABLET | Freq: Every day | ORAL | Status: DC
  Administered 2023-07-23 – 2023-07-27 (×4): 81 mg via ORAL
  Filled 2023-07-23 (×5): qty 1

## 2023-07-23 MED ORDER — ORAL CARE MOUTH RINSE: 15.0000 mL | OROMUCOSAL | Status: DC | PRN

## 2023-07-23 MED ORDER — CHLORHEXIDINE GLUCONATE CLOTH 2 % EX PADS
6.0000 | MEDICATED_PAD | Freq: Every day | CUTANEOUS | Status: DC
  Administered 2023-07-23 – 2023-07-27 (×5): 6 via TOPICAL

## 2023-07-23 MED ORDER — ENOXAPARIN SODIUM 40 MG/0.4ML IJ SOSY
40.0000 mg | PREFILLED_SYRINGE | Freq: Every day | INTRAMUSCULAR | Status: DC
  Administered 2023-07-23 – 2023-07-26 (×4): 40 mg via SUBCUTANEOUS
  Filled 2023-07-23 (×4): qty 0.4

## 2023-07-23 NOTE — Progress Notes (Signed)
Patient ambulated in hall with walker and +1 assistance.  Patient very weak and required a recliner back to her room.  Ambulated approx 15 feet.

## 2023-07-23 NOTE — Progress Notes (Signed)
Patient continues to demonstrate significant weakness.  Attempted to move patient from chair to bed. Patient's knees buckled multiple times requiring max assist to bed.

## 2023-07-23 NOTE — Progress Notes (Signed)
Pt attempted to ambulate with RN earlier, very weak. Now in recliner, c/o significant fatigue. Slow conversation but not confused.   Gave pt MI book and discussed MI/stent/Brilinta. Will do more education Mon/Tues as pt is tired. HR 54 in recliner asleep.  [phone] Ethelda Chick BS, ACSM-CEP 07/23/2023 10:41 AM

## 2023-07-23 NOTE — Progress Notes (Signed)
Echocardiogram 2D Echocardiogram has been performed.  Eshika Reckart N Janin Kozlowski,RDCS 07/23/2023, 1:39 PM

## 2023-07-23 NOTE — Progress Notes (Signed)
Patient Name: QUINCI PERRET Date of Encounter: 07/23/2023 [facility] Cardiologist: None   Interval Summary  .    LAD STEMI yesterday s/p PCI by Dr. Jacinto Halim. No acute overnight events. Chest pain improved. No new complaints.   Vital Signs .    Vitals:   07/23/23 0645 07/23/23 0700 07/23/23 0800 07/23/23 0836  BP: 108/66 117/60 (!) 97/55   Pulse: 61 68 (!) 57   Resp: 20 20 20    Temp:    97.6 F (36.4 C)  TempSrc:    Oral  SpO2: 94% 95% 96%   Weight:      Height:        Intake/Output Summary (Last 24 hours) at 07/23/2023 0904 Last data filed at 07/22/2023 2146 Gross per 24 hour  Intake 183.67 ml  Output 600 ml  Net -416.33 ml      07/22/2023    3:58 PM 03/30/2022    2:03 PM 07/29/2017    5:07 PM  Last 3 Weights  Weight (lbs) 125 lb 126 lb 1.7 oz 126 lb  Weight (kg) 56.7 kg 57.2 kg 57.153 kg      Telemetry/ECG    Sinus bradycardia, no VAs - Personally Reviewed  Physical Exam .   GEN: No acute distress.   Neck: No JVD Cardiac: Normal rate, regular rhythm.  Respiratory: Clear to auscultation bilaterally. GI: Soft, nontender, non-distended  MS: No edema  LHC 12/6:  Angiographic data: RCA: Very large caliber vessel and dominant vessel.  There is a ostial 30% stenosis.  Mid segment of the RV branch has a 90% stenosis.  Distal RCA is a focal 99% what appears to be an ulcerated stenosis. LM: Large vessel, smooth and normal. LAD: Moderate caliber vessel, gives origin to small D1 and D2, D2 has a secondary branch.  D1 has ostial 90% and D2 is subtotally occluded and diffusely diseased with TIMI-3 flow.  LAD has mid segment tortuosity.  There is a long segment high-grade 99% stenosis extending from D1 all the way to the mid to distal segment with TIMI I flow. LCx: Moderate to large caliber vessel, gives origin to large OM1 and moderate-sized OM 2 which are tortuous with minimal disease.   Interventional data: Successful difficult procedure due to acute  angulation and subtotally occluded LAD with acute bend in the midsegment, long 2.5 x 38 mm followed by a overlapping 2.5 x 16 mm Synergy XD DES stent implanted and postdilated with the 2.5 mm x 16 mm stent balloon at 14 atmospheric pressure throughout the stented segment.  Stenosis reduced to 0% with improvement in TIMI flow from 1-3. Impression and recommendations: Patient will need DAPT for 1 year and aspirin indefinitely.  Needs aggressive therapy for hypertension and hyperlipidemia.  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  Assessment & Plan .     DELISA KENDZIERSKI is a 72 y.o. female with a past medical history notable for hypertension who presented with chest pain on 07/22/2023 for the evaluation of STEMI.   #. LAD STEMI:   - S/p DES x 2 - Continue aspirin and Ticagrelor.  - Continue atorvastatin.  - Continue metoprolol.  - Echo pending  #. RCA stenosis:  - Plan for staged PCI on Monday.   #. Hypertension: On amlodipine at home.  - Normotensive here. Continue to monitor.  - Echo pending. If LVEF <50% then transition amlodipine to ACE/ARB/ARNI.    For questions  or updates, please contact [facility] Please consult [email or web address] for contact info under        Signed, Nobie Putnam, MD

## 2023-07-24 MED ORDER — SODIUM CHLORIDE 0.9 % IV SOLN
INTRAVENOUS | Status: DC
Start: 2023-07-25 — End: 2023-07-26

## 2023-07-24 MED ORDER — ASPIRIN 81 MG PO CHEW
81.0000 mg | CHEWABLE_TABLET | ORAL | Status: AC
  Administered 2023-07-25: 81 mg via ORAL

## 2023-07-24 NOTE — Plan of Care (Signed)
  Problem: Clinical Measurements: Goal: Ability to maintain clinical measurements within normal limits will improve Outcome: Progressing Goal: Will remain free from infection Outcome: Progressing Goal: Respiratory complications will improve Outcome: Progressing Goal: Cardiovascular complication will be avoided Outcome: Progressing   Problem: Activity: Goal: Risk for activity intolerance will decrease Outcome: Not Progressing Note: Pt requires significant assist for ambulation, weakness in lower extremeties   Problem: Nutrition: Goal: Adequate nutrition will be maintained Outcome: Not Progressing Note: Eating small percentage of meals   Problem: Coping: Goal: Level of anxiety will decrease Outcome: Progressing   Problem: Elimination: Goal: Will not experience complications related to urinary retention Outcome: Progressing   Problem: Pain Management: Goal: General experience of comfort will improve Outcome: Progressing   Problem: Safety: Goal: Ability to remain free from injury will improve Outcome: Progressing   Problem: Skin Integrity: Goal: Risk for impaired skin integrity will decrease Outcome: Progressing   Problem: Activity: Goal: Ability to return to baseline activity level will improve Outcome: Not Progressing   Problem: Cardiovascular: Goal: Ability to achieve and maintain adequate cardiovascular perfusion will improve Outcome: Progressing Goal: Vascular access site(s) Level 0-1 will be maintained Outcome: Progressing

## 2023-07-24 NOTE — Progress Notes (Signed)
Patient Name: Alyssa James Date of Encounter: 07/24/2023 [facility] Cardiologist: None   Interval Summary  .    No acute overnight events. Reports feeling tired. Difficulty sleeping in ICU. No chest pain.   Vital Signs .    Vitals:   07/24/23 0500 07/24/23 0600 07/24/23 0709 07/24/23 0815  BP: (!) 124/51 (!) 119/53 104/60   Pulse: 65 63 83   Resp: (!) 25 (!) 23 (!) 24   Temp:    97.7 F (36.5 C)  TempSrc:    Oral  SpO2: 96% 95% 95%   Weight:      Height:        Intake/Output Summary (Last 24 hours) at 07/24/2023 0924 Last data filed at 07/24/2023 0630 Gross per 24 hour  Intake 490 ml  Output 1125 ml  Net -635 ml      07/22/2023    3:58 PM 03/30/2022    2:03 PM 07/29/2017    5:07 PM  Last 3 Weights  Weight (lbs) 125 lb 126 lb 1.7 oz 126 lb  Weight (kg) 56.7 kg 57.2 kg 57.153 kg      Telemetry/ECG    Sinus bradycardia, no VAs - Personally Reviewed  Physical Exam .   GEN: No acute distress.   Neck: No JVD Cardiac: Normal rate, regular rhythm.  Respiratory: Clear to auscultation bilaterally. GI: Soft, nontender, non-distended  MS: No edema  LHC 12/6:  Angiographic data: RCA: Very large caliber vessel and dominant vessel.  There is a ostial 30% stenosis.  Mid segment of the RV branch has a 90% stenosis.  Distal RCA is a focal 99% what appears to be an ulcerated stenosis. LM: Large vessel, smooth and normal. LAD: Moderate caliber vessel, gives origin to small D1 and D2, D2 has a secondary branch.  D1 has ostial 90% and D2 is subtotally occluded and diffusely diseased with TIMI-3 flow.  LAD has mid segment tortuosity.  There is a long segment high-grade 99% stenosis extending from D1 all the way to the mid to distal segment with TIMI I flow. LCx: Moderate to large caliber vessel, gives origin to large OM1 and moderate-sized OM 2 which are tortuous with minimal disease.   Interventional data: Successful difficult procedure due to acute angulation and  subtotally occluded LAD with acute bend in the midsegment, long 2.5 x 38 mm followed by a overlapping 2.5 x 16 mm Synergy XD DES stent implanted and postdilated with the 2.5 mm x 16 mm stent balloon at 14 atmospheric pressure throughout the stented segment.  Stenosis reduced to 0% with improvement in TIMI flow from 1-3. Impression and recommendations: Patient will need DAPT for 1 year and aspirin indefinitely.  Needs aggressive therapy for hypertension and hyperlipidemia.  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  Echo 07/23/23:  1. Septal , apical, distal anterior wall and inferior apical hypokinesis  consistent with multivessel CAD. Left ventricular ejection fraction, by  estimation, is 45 to 50%. The left ventricle has mildly decreased  function. The left ventricle demonstrates  regional wall motion abnormalities (see scoring diagram/findings for  description). The left ventricular internal cavity size was mildly  dilated. Left ventricular diastolic parameters were normal.   2. Right ventricular systolic function is normal. The right ventricular  size is normal.   3. The mitral valve is abnormal. Trivial mitral valve regurgitation. No  evidence of mitral stenosis.   4. The aortic valve is  tricuspid. There is moderate calcification of the  aortic valve. There is moderate thickening of the aortic valve. Aortic  valve regurgitation is mild. Aortic valve sclerosis is present, with no  evidence of aortic valve stenosis.   5. The inferior vena cava is normal in size with greater than 50%  respiratory variability, suggesting right atrial pressure of 3 mmHg.   Assessment & Plan .     Alyssa James is a 72 y.o. female with a past medical history notable for hypertension who presented with chest pain on 07/22/2023 for the evaluation of STEMI.   #. LAD STEMI:   - S/p DES x 2 - Continue aspirin and Ticagrelor.  - Continue  atorvastatin.  - Continue metoprolol.   #. RCA stenosis:  - Plan for staged PCI tomorrow.   #. Acute systolic heart failure - mid range LVEF 45-50%:  - Continue metoprolol, transition to XL on discharge.  - Start ACEI/ARB after repeat LHC.   #. Hypertension: On amlodipine at home.  - Normotensive here. Continue to monitor.  - Given LVEF, replace amlodipine with ACEI/ARB on discharge.  Transfer out of ICU to regular nursing floor with telemetry.  For questions or updates, please contact [facility] Please consult [email or web address] for contact info under        Signed, Nobie Putnam, MD

## 2023-07-25 ENCOUNTER — Inpatient Hospital Stay (HOSPITAL_COMMUNITY): Payer: Medicare Other

## 2023-07-25 ENCOUNTER — Encounter (HOSPITAL_COMMUNITY): Payer: Self-pay | Admitting: Cardiology

## 2023-07-25 ENCOUNTER — Other Ambulatory Visit (HOSPITAL_BASED_OUTPATIENT_CLINIC_OR_DEPARTMENT_OTHER): Payer: Self-pay

## 2023-07-25 ENCOUNTER — Other Ambulatory Visit (HOSPITAL_COMMUNITY): Payer: Self-pay

## 2023-07-25 ENCOUNTER — Encounter (HOSPITAL_COMMUNITY)
Admission: EM | Disposition: A | Discharge status: Home Health | Payer: Self-pay | Source: Home / Self Care | Attending: Cardiology

## 2023-07-25 ENCOUNTER — Telehealth (HOSPITAL_COMMUNITY): Payer: Self-pay | Admitting: Pharmacy Technician

## 2023-07-25 DIAGNOSIS — J189 Pneumonia, unspecified organism: Secondary | ICD-10-CM

## 2023-07-25 HISTORY — PX: CORONARY STENT INTERVENTION: CATH118234

## 2023-07-25 HISTORY — PX: CORONARY BALLOON ANGIOPLASTY: CATH118233

## 2023-07-25 LAB — BASIC METABOLIC PANEL
Anion gap: 9 (ref 5–15)
BUN: 13 mg/dL (ref 8–23)
CO2: 22 mmol/L (ref 22–32)
Calcium: 8.3 mg/dL — ABNORMAL LOW (ref 8.9–10.3)
Chloride: 103 mmol/L (ref 98–111)
Creatinine, Ser: 0.81 mg/dL (ref 0.44–1.00)
GFR, Estimated: >60 mL/min (ref >60)
Glucose, Bld: 100 mg/dL — ABNORMAL HIGH (ref 70–99)
Potassium: 3.8 mmol/L (ref 3.5–5.1)
Sodium: 134 mmol/L — ABNORMAL LOW (ref 135–145)

## 2023-07-25 LAB — CBC
HCT: 30.3 % — ABNORMAL LOW (ref 36.0–46.0)
Hemoglobin: 10 g/dL — ABNORMAL LOW (ref 12.0–15.0)
MCH: 29.2 pg (ref 26.0–34.0)
MCHC: 33 g/dL (ref 30.0–36.0)
MCV: 88.6 fL (ref 80.0–100.0)
Platelets: 424 10*3/uL — ABNORMAL HIGH (ref 150–400)
RBC: 3.42 MIL/uL — ABNORMAL LOW (ref 3.87–5.11)
RDW: 12.3 % (ref 11.5–15.5)
WBC: 19.7 10*3/uL — ABNORMAL HIGH (ref 4.0–10.5)
nRBC: 0 % (ref 0.0–0.2)

## 2023-07-25 LAB — HEPATIC FUNCTION PANEL
ALT: 46 U/L — ABNORMAL HIGH (ref 0–44)
AST: 35 U/L (ref 15–41)
Albumin: 2.8 g/dL — ABNORMAL LOW (ref 3.5–5.0)
Alkaline Phosphatase: 105 U/L (ref 38–126)
Bilirubin, Direct: <0.1 mg/dL (ref 0.0–0.2)
Total Bilirubin: 0.9 mg/dL (ref <1.2)
Total Protein: 5.8 g/dL — ABNORMAL LOW (ref 6.5–8.1)

## 2023-07-25 LAB — POCT ACTIVATED CLOTTING TIME: Activated Clotting Time: 256 s

## 2023-07-25 SURGERY — CORONARY STENT INTERVENTION
Anesthesia: LOCAL

## 2023-07-25 MED ORDER — SODIUM CHLORIDE 0.9 % IV BOLUS
INTRAVENOUS | Status: DC | PRN
  Administered 2023-07-25: 500 mL via INTRAVENOUS

## 2023-07-25 MED ORDER — LIDOCAINE HCL (PF) 1 % IJ SOLN
INTRAMUSCULAR | Status: DC | PRN
  Administered 2023-07-25: 2 mL

## 2023-07-25 MED ORDER — MIDAZOLAM HCL 2 MG/2ML IJ SOLN
INTRAMUSCULAR | Status: DC | PRN
  Administered 2023-07-25: 1 mg via INTRAVENOUS

## 2023-07-25 MED ORDER — HEPARIN SODIUM (PORCINE) 1000 UNIT/ML IJ SOLN
INTRAMUSCULAR | Status: AC
  Filled 2023-07-25: qty 10

## 2023-07-25 MED ORDER — MIDAZOLAM HCL 2 MG/2ML IJ SOLN
INTRAMUSCULAR | Status: AC
  Filled 2023-07-25: qty 2

## 2023-07-25 MED ORDER — HEPARIN (PORCINE) IN NACL 1000-0.9 UT/500ML-% IV SOLN
INTRAVENOUS | Status: DC | PRN
  Administered 2023-07-25 (×2): 500 mL

## 2023-07-25 MED ORDER — VERAPAMIL HCL 2.5 MG/ML IV SOLN
INTRAVENOUS | Status: DC | PRN
  Administered 2023-07-25: 10 mL via INTRA_ARTERIAL

## 2023-07-25 MED ORDER — VERAPAMIL HCL 2.5 MG/ML IV SOLN
INTRAVENOUS | Status: AC
Start: 2023-07-25 — End: ?
  Filled 2023-07-25: qty 2

## 2023-07-25 MED ORDER — LIDOCAINE HCL (PF) 1 % IJ SOLN
INTRAMUSCULAR | Status: AC
  Filled 2023-07-25: qty 30

## 2023-07-25 MED ORDER — AZITHROMYCIN 500 MG PO TABS
500.0000 mg | ORAL_TABLET | Freq: Every day | ORAL | Status: AC
  Administered 2023-07-25 – 2023-07-27 (×3): 500 mg via ORAL
  Filled 2023-07-25 (×3): qty 1

## 2023-07-25 MED ORDER — SODIUM CHLORIDE 0.9 % WEIGHT BASED INFUSION
1.0000 mL/kg/h | INTRAVENOUS | Status: AC
  Administered 2023-07-25: 1 mL/kg/h via INTRAVENOUS

## 2023-07-25 MED ORDER — FENTANYL CITRATE (PF) 100 MCG/2ML IJ SOLN
INTRAMUSCULAR | Status: AC
  Filled 2023-07-25: qty 2

## 2023-07-25 MED ORDER — SODIUM CHLORIDE 0.9 % IV SOLN
2.0000 g | INTRAVENOUS | Status: DC
  Administered 2023-07-25 – 2023-07-26 (×2): 2 g via INTRAVENOUS
  Filled 2023-07-25 (×3): qty 20

## 2023-07-25 MED ORDER — HEPARIN SODIUM (PORCINE) 1000 UNIT/ML IJ SOLN
INTRAMUSCULAR | Status: DC | PRN
  Administered 2023-07-25: 7000 [IU] via INTRAVENOUS

## 2023-07-25 MED ORDER — FENTANYL CITRATE (PF) 100 MCG/2ML IJ SOLN
INTRAMUSCULAR | Status: DC | PRN
  Administered 2023-07-25: 50 ug via INTRAVENOUS

## 2023-07-25 MED ORDER — IOHEXOL 350 MG/ML SOLN
INTRAVENOUS | Status: DC | PRN
  Administered 2023-07-25: 80 mL

## 2023-07-25 MED FILL — Ticagrelor Tab 90 MG: ORAL | Qty: 2 | Status: AC

## 2023-07-25 SURGICAL SUPPLY — 22 items
BALLN ~~LOC~~ EMERGE MR 2.5X8 (BALLOONS) ×1
BALLN ~~LOC~~ EMERGE MR 3.5X15 (BALLOONS) ×1
BALLOON ~~LOC~~ EMERGE MR 2.5X8 (BALLOONS) IMPLANT
BALLOON ~~LOC~~ EMERGE MR 3.5X15 (BALLOONS) IMPLANT
CATH LAUNCHER 6FR AL1 (CATHETERS) IMPLANT
CATH LAUNCHER 6FR AR1 (CATHETERS) IMPLANT
CATH LAUNCHER 6FR JR4 (CATHETERS) IMPLANT
CATHETER LAUNCHER 6FR AL1 (CATHETERS) ×1
DEVICE RAD TR BAND REGULAR (VASCULAR PRODUCTS) IMPLANT
ELECT DEFIB PAD ADLT CADENCE (PAD) IMPLANT
GLIDESHEATH SLEND A-KIT 6F 22G (SHEATH) IMPLANT
GUIDEWIRE INQWIRE 1.5J.035X260 (WIRE) IMPLANT
INQWIRE 1.5J .035X260CM (WIRE) ×1
KIT ENCORE 26 ADVANTAGE (KITS) IMPLANT
PACK CARDIAC CATHETERIZATION (CUSTOM PROCEDURE TRAY) ×1 IMPLANT
SET ATX-X65L (MISCELLANEOUS) IMPLANT
STENT SYNERGY XD 3.0X12 (Permanent Stent) IMPLANT
STENT SYNERGY XD 3.50X32 (Permanent Stent) IMPLANT
SYNERGY XD 3.0X12 (Permanent Stent) ×1 IMPLANT
SYNERGY XD 3.50X32 (Permanent Stent) ×1 IMPLANT
TUBING CIL FLEX 10 FLL-RA (TUBING) IMPLANT
WIRE RUNTHROUGH .014X180CM (WIRE) IMPLANT

## 2023-07-25 NOTE — H&P (View-Only) (Signed)
Rounding Note    Patient Name: Alyssa James Date of Encounter: 07/25/2023  [facility] Cardiologist: Yates Decamp, MD   Subjective   Patient very weak. Has a cough. Denies chest pain or SOB  Inpatient Medications    Scheduled Meds:  aspirin  81 mg Oral Daily   atorvastatin  80 mg Oral Daily   Chlorhexidine Gluconate Cloth  6 each Topical Daily   enoxaparin (LOVENOX) injection  40 mg Subcutaneous QHS   losartan  25 mg Oral Daily   metoprolol succinate  25 mg Oral Daily   sodium chloride flush  3 mL Intravenous Q12H   ticagrelor  90 mg Oral BID   Continuous Infusions:  sodium chloride 40 mL/hr at 07/25/23 0802   PRN Meds: acetaminophen, albuterol, ondansetron (ZOFRAN) IV, ondansetron, mouth rinse, sodium chloride flush   Vital Signs    Vitals:   07/25/23 0400 07/25/23 0406 07/25/23 0600 07/25/23 0802  BP: (!) 127/56  121/79 120/68  Pulse: 68  79 91  Resp: (!) 22  13 17   Temp:  98.4 F (36.9 C)    TempSrc:  Oral    SpO2: 98%  97% 94%  Weight:      Height:        Intake/Output Summary (Last 24 hours) at 07/25/2023 0925 Last data filed at 07/25/2023 0802 Gross per 24 hour  Intake 562.32 ml  Output 550 ml  Net 12.32 ml      07/24/2023    8:00 PM 07/22/2023    3:58 PM 03/30/2022    2:03 PM  Last 3 Weights  Weight (lbs) 125 lb 14.1 oz 125 lb 126 lb 1.7 oz  Weight (kg) 57.1 kg 56.7 kg 57.2 kg      Telemetry    NSR - Personally Reviewed  ECG    Dated 07/23/23. NSR with deep T wave inversion anteriorly. Prolonged QTc 509 msec.  - Personally Reviewed  Physical Exam   GEN: elderly WF weak, coughing   Neck: No JVD Cardiac: RRR, no murmurs, rubs, or gallops.  Respiratory: Clear to auscultation bilaterally. GI: Soft, nontender, non-distended  MS: No edema; No deformity. No radial site hematoma Neuro:  Nonfocal  Psych: Normal affect   Labs    High Sensitivity Troponin:   Recent Labs  Lab 07/22/23 1600  TROPONINIHS >24,000*      Chemistry Recent Labs  Lab 07/22/23 1600 07/22/23 1607 07/23/23 0332 07/25/23 0732  NA 135 136 135 134*  K 3.4* 3.4* 4.6 3.8  CL 103 103 105 103  CO2 19*  --  22 22  GLUCOSE 144* 149* 101* 100*  BUN 24* 24* 23 13  CREATININE 0.87 0.80 1.02* 0.81  CALCIUM 8.8*  --  8.5* 8.3*  PROT 6.4*  --   --   --   ALBUMIN 3.1*  --   --   --   AST 144*  --   --   --   ALT 99*  --   --   --   ALKPHOS 137*  --   --   --   BILITOT 0.4  --   --   --   GFRNONAA >60  --  58* >60  ANIONGAP 13  --  8 9    Lipids  Recent Labs  Lab 07/22/23 1600  CHOL 154  TRIG 86  HDL 39*  LDLCALC 98  CHOLHDL 3.9    Hematology Recent Labs  Lab 07/22/23 1600 07/22/23 1607 07/23/23 0332 07/25/23  0732  WBC 15.7*  --  14.1* 19.7*  RBC 3.97  --  3.27* 3.42*  HGB 11.5* 10.9* 9.6* 10.0*  HCT 34.4* 32.0* 29.0* 30.3*  MCV 86.6  --  88.7 88.6  MCH 29.0  --  29.4 29.2  MCHC 33.4  --  33.1 33.0  RDW 12.2  --  12.4 12.3  PLT 398  --  364 424*   Thyroid No results for input(s): "TSH", "FREET4" in the last 168 hours.  BNPNo results for input(s): "BNP", "PROBNP" in the last 168 hours.  DDimer No results for input(s): "DDIMER" in the last 168 hours.   Radiology    ECHOCARDIOGRAM COMPLETE  Result Date: 07/23/2023    ECHOCARDIOGRAM REPORT   Patient Name:   Alyssa James Date of Exam: 07/23/2023 Medical Rec #:  [id number]      Height:       61.0 in Accession #:    [id number]     Weight:       125.0 lb Date of Birth:  1951-02-03      BSA:          1.547 m Patient Age:    72 years       BP:           117/60 mmHg Patient Gender: F              HR:           63 bpm. Exam Location:  Inpatient Procedure: 2D Echo, Color Doppler, Cardiac Doppler and Strain Analysis Indications:    Sepsis  History:        Patient has no prior history of Echocardiogram examinations.                 STEMI; Risk Factors:Hypertension and Dyslipidemia. Raynaud                 Disease.  Sonographer:    Raeford Razor RDCS Referring Phys: Yates Decamp  IMPRESSIONS  1. Septal , apical, distal anterior wall and inferior apical hypokinesis consistent with multivessel CAD. Left ventricular ejection fraction, by estimation, is 45 to 50%. The left ventricle has mildly decreased function. The left ventricle demonstrates regional wall motion abnormalities (see scoring diagram/findings for description). The left ventricular internal cavity size was mildly dilated. Left ventricular diastolic parameters were normal.  2. Right ventricular systolic function is normal. The right ventricular size is normal.  3. The mitral valve is abnormal. Trivial mitral valve regurgitation. No evidence of mitral stenosis.  4. The aortic valve is tricuspid. There is moderate calcification of the aortic valve. There is moderate thickening of the aortic valve. Aortic valve regurgitation is mild. Aortic valve sclerosis is present, with no evidence of aortic valve stenosis.  5. The inferior vena cava is normal in size with greater than 50% respiratory variability, suggesting right atrial pressure of 3 mmHg. FINDINGS  Left Ventricle: Septal , apical, distal anterior wall and inferior apical hypokinesis consistent with multivessel CAD. Left ventricular ejection fraction, by estimation, is 45 to 50%. The left ventricle has mildly decreased function. The left ventricle demonstrates regional wall motion abnormalities. The left ventricular internal cavity size was mildly dilated. There is no left ventricular hypertrophy. Left ventricular diastolic parameters were normal. Right Ventricle: The right ventricular size is normal. No increase in right ventricular wall thickness. Right ventricular systolic function is normal. Left Atrium: Left atrial size was normal in size. Right Atrium: Right atrial size was normal in size. Pericardium: There  is no evidence of pericardial effusion. Mitral Valve: The mitral valve is abnormal. There is mild thickening of the mitral valve leaflet(s). Trivial mitral valve  regurgitation. No evidence of mitral valve stenosis. Tricuspid Valve: The tricuspid valve is normal in structure. Tricuspid valve regurgitation is trivial. No evidence of tricuspid stenosis. Aortic Valve: The aortic valve is tricuspid. There is moderate calcification of the aortic valve. There is moderate thickening of the aortic valve. Aortic valve regurgitation is mild. Aortic valve sclerosis is present, with no evidence of aortic valve stenosis. Aortic valve mean gradient measures 5.5 mmHg. Aortic valve peak gradient measures 9.7 mmHg. Aortic valve area, by VTI measures 2.11 cm. Pulmonic Valve: The pulmonic valve was normal in structure. Pulmonic valve regurgitation is mild. No evidence of pulmonic stenosis. Aorta: The aortic root is normal in size and structure. Venous: The inferior vena cava is normal in size with greater than 50% respiratory variability, suggesting right atrial pressure of 3 mmHg. IAS/Shunts: No atrial level shunt detected by color flow Doppler.  LEFT VENTRICLE PLAX 2D LVIDd:         3.40 cm   Diastology LVIDs:         2.40 cm   LV e' medial:    7.94 cm/s LV PW:         0.90 cm   LV E/e' medial:  10.2 LV IVS:        1.10 cm   LV e' lateral:   6.09 cm/s LVOT diam:     1.80 cm   LV E/e' lateral: 13.3 LV SV:         73 LV SV Index:   47 LVOT Area:     2.54 cm  RIGHT VENTRICLE             IVC RV Basal diam:  2.50 cm     IVC diam: 1.30 cm RV S prime:     14.90 cm/s TAPSE (M-mode): 1.6 cm LEFT ATRIUM             Index        RIGHT ATRIUM          Index LA diam:        3.70 cm 2.39 cm/m   RA Area:     9.04 cm LA Vol (A2C):   43.8 ml 28.32 ml/m  RA Volume:   16.90 ml 10.93 ml/m LA Vol (A4C):   45.9 ml 29.67 ml/m LA Biplane Vol: 48.7 ml 31.48 ml/m  AORTIC VALVE AV Area (Vmax):    1.78 cm AV Area (Vmean):   1.96 cm AV Area (VTI):     2.11 cm AV Vmax:           156.00 cm/s AV Vmean:          105.400 cm/s AV VTI:            0.347 m AV Peak Grad:      9.7 mmHg AV Mean Grad:      5.5 mmHg LVOT  Vmax:         109.00 cm/s LVOT Vmean:        81.100 cm/s LVOT VTI:          0.287 m LVOT/AV VTI ratio: 0.83  AORTA Ao Root diam: 2.60 cm Ao Asc diam:  3.10 cm MITRAL VALVE               TRICUSPID VALVE MV Area (PHT): 4.63 cm    TR Peak grad:  13.5 mmHg MV Decel Time: 164 msec    TR Vmax:        184.00 cm/s MV E velocity: 81.10 cm/s MV A velocity: 88.40 cm/s  SHUNTS MV E/A ratio:  0.92        Systemic VTI:  0.29 m                            Systemic Diam: 1.80 cm Charlton Haws MD Electronically signed by Charlton Haws MD Signature Date/Time: 07/23/2023/2:15:24 PM    Final     Cardiac Studies   Coronary/Graft Acute MI Revascularization  CORONARY STENT INTERVENTION  LEFT HEART CATH AND CORONARY ANGIOGRAPHY   Conclusion  Left Heart Catheterization 07/22/23: Hemodynamic data: LV: 129/7, EDP 23 mmHg.  Ao 130/60, mean 91 mmHg.  No pressure gradient across the aortic valve.   Angiographic data: RCA: Very large caliber vessel and dominant vessel.  There is a ostial 30% stenosis.  Mid segment of the RV branch has a 90% stenosis.  Distal RCA is a focal 99% what appears to be an ulcerated stenosis. LM: Large vessel, smooth and normal. LAD: Moderate caliber vessel, gives origin to small D1 and D2, D2 has a secondary branch.  D1 has ostial 90% and D2 is subtotally occluded and diffusely diseased with TIMI-3 flow.  LAD has mid segment tortuosity.  There is a long segment high-grade 99% stenosis extending from D1 all the way to the mid to distal segment with TIMI I flow. LCx: Moderate to large caliber vessel, gives origin to large OM1 and moderate-sized OM 2 which are tortuous with minimal disease.   Interventional data: Successful difficult procedure due to acute angulation and subtotally occluded LAD with acute bend in the midsegment, long 2.5 x 38 mm followed by a overlapping 2.5 x 16 mm Synergy XD DES stent implanted and postdilated with the 2.5 mm x 16 mm stent balloon at 14 atmospheric pressure throughout  the stented segment.  Stenosis reduced to 0% with improvement in TIMI flow from 1-3.      Impression and recommendations: Patient will need DAPT for 1 year and aspirin indefinitely.  Needs aggressive therapy for hypertension and hyperlipidemia.  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    Patient Profile     72 y.o. female with HTN, HLD, high coronary calcium score admitted with acute anterior STEMI. Also recently diagnosed by PCP with RUL PNA. Last antibiotic dose on 12/5.   Assessment & Plan    Acute anterior STEMI. S/p emergent stenting of the LAD with DES x 2. Troponin > 24K. Ecg with resolution of ST elevation. Persistent TW inversion. EF 45-50% on Echo. On DAPT with ASA and Brilinta. On Toprol and losartan. Plan staged PCI today of tandem lesions in the mid and distal RCA.  RUL PNA. Recently diagnosed as outpatient. Persistent cough. WBC elevated 19K. Will plan treating for CAP with IV Rocephin and Z pak. Repeat CXR. Follow WBC Elevated LFTs on admit in setting of MI. Repeat liver panel today.  Marked deconditioning due to #1 and #2. Will order PT consult.  HTN. Controlled LV dysfunction. Hopefully will improve with revascularization. On losartan and Toprol. Consider Sherryll Burger but cost may be an issue.  7.   HLD. On high dose statin     For questions or updates, please contact [facility] Please consult [email or web address] for contact info under  Signed, Nikiesha Milford [place], MD  07/25/2023, 9:25 AM

## 2023-07-25 NOTE — Progress Notes (Signed)
Rounding Note    Patient Name: Alyssa James Date of Encounter: 07/25/2023  [facility] Cardiologist: Yates Decamp, MD   Subjective   Patient very weak. Has a cough. Denies chest pain or SOB  Inpatient Medications    Scheduled Meds:  aspirin  81 mg Oral Daily   atorvastatin  80 mg Oral Daily   Chlorhexidine Gluconate Cloth  6 each Topical Daily   enoxaparin (LOVENOX) injection  40 mg Subcutaneous QHS   losartan  25 mg Oral Daily   metoprolol succinate  25 mg Oral Daily   sodium chloride flush  3 mL Intravenous Q12H   ticagrelor  90 mg Oral BID   Continuous Infusions:  sodium chloride 40 mL/hr at 07/25/23 0802   PRN Meds: acetaminophen, albuterol, ondansetron (ZOFRAN) IV, ondansetron, mouth rinse, sodium chloride flush   Vital Signs    Vitals:   07/25/23 0400 07/25/23 0406 07/25/23 0600 07/25/23 0802  BP: (!) 127/56  121/79 120/68  Pulse: 68  79 91  Resp: (!) 22  13 17   Temp:  98.4 F (36.9 C)    TempSrc:  Oral    SpO2: 98%  97% 94%  Weight:      Height:        Intake/Output Summary (Last 24 hours) at 07/25/2023 0925 Last data filed at 07/25/2023 0802 Gross per 24 hour  Intake 562.32 ml  Output 550 ml  Net 12.32 ml      07/24/2023    8:00 PM 07/22/2023    3:58 PM 03/30/2022    2:03 PM  Last 3 Weights  Weight (lbs) 125 lb 14.1 oz 125 lb 126 lb 1.7 oz  Weight (kg) 57.1 kg 56.7 kg 57.2 kg      Telemetry    NSR - Personally Reviewed  ECG    Dated 07/23/23. NSR with deep T wave inversion anteriorly. Prolonged QTc 509 msec.  - Personally Reviewed  Physical Exam   GEN: elderly WF weak, coughing   Neck: No JVD Cardiac: RRR, no murmurs, rubs, or gallops.  Respiratory: Clear to auscultation bilaterally. GI: Soft, nontender, non-distended  MS: No edema; No deformity. No radial site hematoma Neuro:  Nonfocal  Psych: Normal affect   Labs    High Sensitivity Troponin:   Recent Labs  Lab 07/22/23 1600  TROPONINIHS >24,000*      Chemistry Recent Labs  Lab 07/22/23 1600 07/22/23 1607 07/23/23 0332 07/25/23 0732  NA 135 136 135 134*  K 3.4* 3.4* 4.6 3.8  CL 103 103 105 103  CO2 19*  --  22 22  GLUCOSE 144* 149* 101* 100*  BUN 24* 24* 23 13  CREATININE 0.87 0.80 1.02* 0.81  CALCIUM 8.8*  --  8.5* 8.3*  PROT 6.4*  --   --   --   ALBUMIN 3.1*  --   --   --   AST 144*  --   --   --   ALT 99*  --   --   --   ALKPHOS 137*  --   --   --   BILITOT 0.4  --   --   --   GFRNONAA >60  --  58* >60  ANIONGAP 13  --  8 9    Lipids  Recent Labs  Lab 07/22/23 1600  CHOL 154  TRIG 86  HDL 39*  LDLCALC 98  CHOLHDL 3.9    Hematology Recent Labs  Lab 07/22/23 1600 07/22/23 1607 07/23/23 0332 07/25/23  0732  WBC 15.7*  --  14.1* 19.7*  RBC 3.97  --  3.27* 3.42*  HGB 11.5* 10.9* 9.6* 10.0*  HCT 34.4* 32.0* 29.0* 30.3*  MCV 86.6  --  88.7 88.6  MCH 29.0  --  29.4 29.2  MCHC 33.4  --  33.1 33.0  RDW 12.2  --  12.4 12.3  PLT 398  --  364 424*   Thyroid No results for input(s): "TSH", "FREET4" in the last 168 hours.  BNPNo results for input(s): "BNP", "PROBNP" in the last 168 hours.  DDimer No results for input(s): "DDIMER" in the last 168 hours.   Radiology    ECHOCARDIOGRAM COMPLETE  Result Date: 07/23/2023    ECHOCARDIOGRAM REPORT   Patient Name:   Alyssa James Date of Exam: 07/23/2023 Medical Rec #:  [id number]      Height:       61.0 in Accession #:    [id number]     Weight:       125.0 lb Date of Birth:  1951-02-03      BSA:          1.547 m Patient Age:    72 years       BP:           117/60 mmHg Patient Gender: F              HR:           63 bpm. Exam Location:  Inpatient Procedure: 2D Echo, Color Doppler, Cardiac Doppler and Strain Analysis Indications:    Sepsis  History:        Patient has no prior history of Echocardiogram examinations.                 STEMI; Risk Factors:Hypertension and Dyslipidemia. Raynaud                 Disease.  Sonographer:    Raeford Razor RDCS Referring Phys: Yates Decamp  IMPRESSIONS  1. Septal , apical, distal anterior wall and inferior apical hypokinesis consistent with multivessel CAD. Left ventricular ejection fraction, by estimation, is 45 to 50%. The left ventricle has mildly decreased function. The left ventricle demonstrates regional wall motion abnormalities (see scoring diagram/findings for description). The left ventricular internal cavity size was mildly dilated. Left ventricular diastolic parameters were normal.  2. Right ventricular systolic function is normal. The right ventricular size is normal.  3. The mitral valve is abnormal. Trivial mitral valve regurgitation. No evidence of mitral stenosis.  4. The aortic valve is tricuspid. There is moderate calcification of the aortic valve. There is moderate thickening of the aortic valve. Aortic valve regurgitation is mild. Aortic valve sclerosis is present, with no evidence of aortic valve stenosis.  5. The inferior vena cava is normal in size with greater than 50% respiratory variability, suggesting right atrial pressure of 3 mmHg. FINDINGS  Left Ventricle: Septal , apical, distal anterior wall and inferior apical hypokinesis consistent with multivessel CAD. Left ventricular ejection fraction, by estimation, is 45 to 50%. The left ventricle has mildly decreased function. The left ventricle demonstrates regional wall motion abnormalities. The left ventricular internal cavity size was mildly dilated. There is no left ventricular hypertrophy. Left ventricular diastolic parameters were normal. Right Ventricle: The right ventricular size is normal. No increase in right ventricular wall thickness. Right ventricular systolic function is normal. Left Atrium: Left atrial size was normal in size. Right Atrium: Right atrial size was normal in size. Pericardium: There  is no evidence of pericardial effusion. Mitral Valve: The mitral valve is abnormal. There is mild thickening of the mitral valve leaflet(s). Trivial mitral valve  regurgitation. No evidence of mitral valve stenosis. Tricuspid Valve: The tricuspid valve is normal in structure. Tricuspid valve regurgitation is trivial. No evidence of tricuspid stenosis. Aortic Valve: The aortic valve is tricuspid. There is moderate calcification of the aortic valve. There is moderate thickening of the aortic valve. Aortic valve regurgitation is mild. Aortic valve sclerosis is present, with no evidence of aortic valve stenosis. Aortic valve mean gradient measures 5.5 mmHg. Aortic valve peak gradient measures 9.7 mmHg. Aortic valve area, by VTI measures 2.11 cm. Pulmonic Valve: The pulmonic valve was normal in structure. Pulmonic valve regurgitation is mild. No evidence of pulmonic stenosis. Aorta: The aortic root is normal in size and structure. Venous: The inferior vena cava is normal in size with greater than 50% respiratory variability, suggesting right atrial pressure of 3 mmHg. IAS/Shunts: No atrial level shunt detected by color flow Doppler.  LEFT VENTRICLE PLAX 2D LVIDd:         3.40 cm   Diastology LVIDs:         2.40 cm   LV e' medial:    7.94 cm/s LV PW:         0.90 cm   LV E/e' medial:  10.2 LV IVS:        1.10 cm   LV e' lateral:   6.09 cm/s LVOT diam:     1.80 cm   LV E/e' lateral: 13.3 LV SV:         73 LV SV Index:   47 LVOT Area:     2.54 cm  RIGHT VENTRICLE             IVC RV Basal diam:  2.50 cm     IVC diam: 1.30 cm RV S prime:     14.90 cm/s TAPSE (M-mode): 1.6 cm LEFT ATRIUM             Index        RIGHT ATRIUM          Index LA diam:        3.70 cm 2.39 cm/m   RA Area:     9.04 cm LA Vol (A2C):   43.8 ml 28.32 ml/m  RA Volume:   16.90 ml 10.93 ml/m LA Vol (A4C):   45.9 ml 29.67 ml/m LA Biplane Vol: 48.7 ml 31.48 ml/m  AORTIC VALVE AV Area (Vmax):    1.78 cm AV Area (Vmean):   1.96 cm AV Area (VTI):     2.11 cm AV Vmax:           156.00 cm/s AV Vmean:          105.400 cm/s AV VTI:            0.347 m AV Peak Grad:      9.7 mmHg AV Mean Grad:      5.5 mmHg LVOT  Vmax:         109.00 cm/s LVOT Vmean:        81.100 cm/s LVOT VTI:          0.287 m LVOT/AV VTI ratio: 0.83  AORTA Ao Root diam: 2.60 cm Ao Asc diam:  3.10 cm MITRAL VALVE               TRICUSPID VALVE MV Area (PHT): 4.63 cm    TR Peak grad:  13.5 mmHg MV Decel Time: 164 msec    TR Vmax:        184.00 cm/s MV E velocity: 81.10 cm/s MV A velocity: 88.40 cm/s  SHUNTS MV E/A ratio:  0.92        Systemic VTI:  0.29 m                            Systemic Diam: 1.80 cm Charlton Haws MD Electronically signed by Charlton Haws MD Signature Date/Time: 07/23/2023/2:15:24 PM    Final     Cardiac Studies   Coronary/Graft Acute MI Revascularization  CORONARY STENT INTERVENTION  LEFT HEART CATH AND CORONARY ANGIOGRAPHY   Conclusion  Left Heart Catheterization 07/22/23: Hemodynamic data: LV: 129/7, EDP 23 mmHg.  Ao 130/60, mean 91 mmHg.  No pressure gradient across the aortic valve.   Angiographic data: RCA: Very large caliber vessel and dominant vessel.  There is a ostial 30% stenosis.  Mid segment of the RV branch has a 90% stenosis.  Distal RCA is a focal 99% what appears to be an ulcerated stenosis. LM: Large vessel, smooth and normal. LAD: Moderate caliber vessel, gives origin to small D1 and D2, D2 has a secondary branch.  D1 has ostial 90% and D2 is subtotally occluded and diffusely diseased with TIMI-3 flow.  LAD has mid segment tortuosity.  There is a long segment high-grade 99% stenosis extending from D1 all the way to the mid to distal segment with TIMI I flow. LCx: Moderate to large caliber vessel, gives origin to large OM1 and moderate-sized OM 2 which are tortuous with minimal disease.   Interventional data: Successful difficult procedure due to acute angulation and subtotally occluded LAD with acute bend in the midsegment, long 2.5 x 38 mm followed by a overlapping 2.5 x 16 mm Synergy XD DES stent implanted and postdilated with the 2.5 mm x 16 mm stent balloon at 14 atmospheric pressure throughout  the stented segment.  Stenosis reduced to 0% with improvement in TIMI flow from 1-3.      Impression and recommendations: Patient will need DAPT for 1 year and aspirin indefinitely.  Needs aggressive therapy for hypertension and hyperlipidemia.  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    Patient Profile     72 y.o. female with HTN, HLD, high coronary calcium score admitted with acute anterior STEMI. Also recently diagnosed by PCP with RUL PNA. Last antibiotic dose on 12/5.   Assessment & Plan    Acute anterior STEMI. S/p emergent stenting of the LAD with DES x 2. Troponin > 24K. Ecg with resolution of ST elevation. Persistent TW inversion. EF 45-50% on Echo. On DAPT with ASA and Brilinta. On Toprol and losartan. Plan staged PCI today of tandem lesions in the mid and distal RCA.  RUL PNA. Recently diagnosed as outpatient. Persistent cough. WBC elevated 19K. Will plan treating for CAP with IV Rocephin and Z pak. Repeat CXR. Follow WBC Elevated LFTs on admit in setting of MI. Repeat liver panel today.  Marked deconditioning due to #1 and #2. Will order PT consult.  HTN. Controlled LV dysfunction. Hopefully will improve with revascularization. On losartan and Toprol. Consider Sherryll Burger but cost may be an issue.  7.   HLD. On high dose statin     For questions or updates, please contact [facility] Please consult [email or web address] for contact info under  Signed, Nikiesha Milford [place], MD  07/25/2023, 9:25 AM

## 2023-07-25 NOTE — Interval H&P Note (Signed)
History and Physical Interval Note:  07/25/2023 10:13 AM  Alyssa James  has presented today for surgery, with the diagnosis of Coronary Artery Disease.  The various methods of treatment have been discussed with the patient and family. After consideration of risks, benefits and other options for treatment, the patient has consented to  Procedure(s): CORONARY STENT INTERVENTION (N/A) as a surgical intervention.  The patient's history has been reviewed, patient examined, no change in status, stable for surgery.  I have reviewed the patient's chart and labs.  Questions were answered to the patient's satisfaction.   Upon review of her angiogram, the distal LAD stent that I placed appears to be minimally not fully expanded, I would like to relook at this as well prior to proceeding with PCI to the right coronary artery.  Yates Decamp

## 2023-07-25 NOTE — Plan of Care (Signed)
  Problem: Clinical Measurements: Goal: Ability to maintain clinical measurements within normal limits will improve Outcome: Progressing Goal: Will remain free from infection Outcome: Progressing Goal: Diagnostic test results will improve Outcome: Progressing Goal: Respiratory complications will improve Outcome: Progressing Goal: Cardiovascular complication will be avoided Outcome: Progressing   Problem: Activity: Goal: Risk for activity intolerance will decrease Outcome: Progressing   Problem: Nutrition: Goal: Adequate nutrition will be maintained Outcome: Progressing   Problem: Coping: Goal: Level of anxiety will decrease Outcome: Progressing   Problem: Elimination: Goal: Will not experience complications related to bowel motility Outcome: Progressing Goal: Will not experience complications related to urinary retention Outcome: Progressing   Problem: Pain Management: Goal: General experience of comfort will improve Outcome: Progressing   Problem: Safety: Goal: Ability to remain free from injury will improve Outcome: Progressing   Problem: Skin Integrity: Goal: Risk for impaired skin integrity will decrease Outcome: Progressing   Problem: Activity: Goal: Ability to return to baseline activity level will improve Outcome: Progressing   Problem: Cardiovascular: Goal: Ability to achieve and maintain adequate cardiovascular perfusion will improve Outcome: Progressing Goal: Vascular access site(s) Level 0-1 will be maintained Outcome: Progressing   Problem: Health Behavior/Discharge Planning: Goal: Ability to safely manage health-related needs after discharge will improve Outcome: Progressing

## 2023-07-25 NOTE — Telephone Encounter (Signed)
Patient Product/process development scientist completed.    The patient is insured through [employer]. Patient has Medicare and is not eligible for a copay card, but may be able to apply for patient assistance, if available.    Ran test claim for Brilinta 90 mg and the current 30 day co-pay is $333.71 due to a deductible.   This test claim was processed through [facility]- copay amounts may vary at other pharmacies due to pharmacy/plan contracts, or as the patient moves through the different stages of their insurance plan.     Roland Earl, CPHT Pharmacy Technician III Certified Patient Advocate [facility] Patient Advocate Team Direct Number: [phone]  Fax: [phone]

## 2023-07-26 LAB — BASIC METABOLIC PANEL
Anion gap: 11 (ref 5–15)
BUN: 16 mg/dL (ref 8–23)
CO2: 20 mmol/L — ABNORMAL LOW (ref 22–32)
Calcium: 8.4 mg/dL — ABNORMAL LOW (ref 8.9–10.3)
Chloride: 107 mmol/L (ref 98–111)
Creatinine, Ser: 0.92 mg/dL (ref 0.44–1.00)
GFR, Estimated: >60 mL/min (ref >60)
Glucose, Bld: 89 mg/dL (ref 70–99)
Potassium: 3.4 mmol/L — ABNORMAL LOW (ref 3.5–5.1)
Sodium: 138 mmol/L (ref 135–145)

## 2023-07-26 LAB — CBC
HCT: 27.5 % — ABNORMAL LOW (ref 36.0–46.0)
Hemoglobin: 9 g/dL — ABNORMAL LOW (ref 12.0–15.0)
MCH: 29.1 pg (ref 26.0–34.0)
MCHC: 32.7 g/dL (ref 30.0–36.0)
MCV: 89 fL (ref 80.0–100.0)
Platelets: 386 10*3/uL (ref 150–400)
RBC: 3.09 MIL/uL — ABNORMAL LOW (ref 3.87–5.11)
RDW: 12.5 % (ref 11.5–15.5)
WBC: 17.4 10*3/uL — ABNORMAL HIGH (ref 4.0–10.5)
nRBC: 0 % (ref 0.0–0.2)

## 2023-07-26 LAB — LIPOPROTEIN A (LPA): Lipoprotein (a): 91.3 nmol/L — ABNORMAL HIGH (ref <75.0)

## 2023-07-26 LAB — GLUCOSE, CAPILLARY: Glucose-Capillary: 86 mg/dL (ref 70–99)

## 2023-07-26 MED ORDER — POTASSIUM CHLORIDE CRYS ER 20 MEQ PO TBCR
40.0000 meq | EXTENDED_RELEASE_TABLET | Freq: Once | ORAL | Status: AC
  Administered 2023-07-26: 40 meq via ORAL
  Filled 2023-07-26: qty 2

## 2023-07-26 MED ORDER — GUAIFENESIN-DM 100-10 MG/5ML PO SYRP
5.0000 mL | ORAL_SOLUTION | ORAL | Status: DC | PRN
  Administered 2023-07-26: 5 mL via ORAL
  Filled 2023-07-26: qty 5

## 2023-07-26 NOTE — [place] FL2 (Signed)
[name] MEDICAID FL2 LEVEL OF CARE FORM     IDENTIFICATION  Patient Name: Alyssa James Birthdate: 11/02/1950 Sex: female Admission Date (Current Location): 07/22/2023  [facility] and [place] Number:  Producer, television/film/video and Address:  [facility] [place], [address] [place], [place] [address]      Provider Number: [id number]  Attending Physician Name and Address:  Alyssa Decamp, MD  Relative Name and Phone Number:  Alyssa James (sister) [phone]    Current Level of Care: Hospital Recommended Level of Care: Skilled Nursing Facility Prior Approval Number:    Date Approved/Denied:   PASRR Number: PASRR under review  Discharge Plan: SNF    Current Diagnoses: Patient Active Problem List   Diagnosis Date Noted   Acute anteroseptal myocardial infarction [facility]) 07/22/2023   Snake bite 12/30/2013   Nausea and vomiting 12/30/2013   Hypokalemia 12/30/2013   Hyperlipidemia     Orientation RESPIRATION BLADDER Height & Weight     Self, Time, Situation, Place  Normal Continent Weight: 125 lb 14.1 oz (57.1 kg) Height:  5\' 1"  (154.9 cm)  BEHAVIORAL SYMPTOMS/MOOD NEUROLOGICAL BOWEL NUTRITION STATUS      Incontinent Diet (Please see discharge summary)  AMBULATORY STATUS COMMUNICATION OF NEEDS Skin   Limited Assist Verbally Other (Comment) (Wound/Incision LDAs)                       Personal Care Assistance Level of Assistance  Bathing, Feeding, Dressing Bathing Assistance: Limited assistance Feeding assistance: Limited assistance Dressing Assistance: Limited assistance     Functional Limitations Info  Sight, Hearing, Speech Sight Info:  Financial trader) Hearing Info: Adequate Speech Info: Adequate    SPECIAL CARE FACTORS FREQUENCY  PT (By licensed PT), OT (By licensed OT)     PT Frequency: 5x min weekly OT Frequency: 5x min weekly            Contractures Contractures Info: Not present    Additional Factors Info  Code Status, Allergies Code  Status Info: FULL Allergies Info: Aspirin,Codeine,Sulfa Antibiotics,Tetracyclines & Related,Tizanidine,Penicillins           Current Medications (07/26/2023):  This is the current hospital active medication list Current Facility-Administered Medications  Medication Dose Route Frequency Provider Last Rate Last Admin   acetaminophen (TYLENOL) tablet 650 mg  650 mg Oral Q4H PRN Alyssa Decamp, MD   650 mg at 07/22/23 2201   albuterol (PROVENTIL) (2.5 MG/3ML) 0.083% nebulizer solution 2.5 mg  2.5 mg Inhalation Q6H PRN Alyssa Decamp, MD       aspirin chewable tablet 81 mg  81 mg Oral Daily Alyssa Decamp, MD   81 mg at 07/26/23 1024   atorvastatin (LIPITOR) tablet 80 mg  80 mg Oral Daily Alyssa Decamp, MD   80 mg at 07/26/23 1025   azithromycin (ZITHROMAX) tablet 500 mg  500 mg Oral Daily Alyssa Decamp, MD   500 mg at 07/26/23 1024   cefTRIAXone (ROCEPHIN) 2 g in sodium chloride 0.9 % 100 mL IVPB  2 g Intravenous Q24H Alyssa Decamp, MD   Stopped at 07/26/23 1123   Chlorhexidine Gluconate Cloth 2 % PADS 6 each  6 each Topical Daily Alyssa Decamp, MD   6 each at 07/26/23 1000   enoxaparin (LOVENOX) injection 40 mg  40 mg Subcutaneous QHS Alyssa Decamp, MD   40 mg at 07/25/23 2300   losartan (COZAAR) tablet 25 mg  25 mg Oral Daily Alyssa Decamp, MD   25 mg at 07/26/23  1024   metoprolol succinate (TOPROL-XL) 24 hr tablet 25 mg  25 mg Oral Daily Alyssa Decamp, MD   25 mg at 07/26/23 1024   ondansetron (ZOFRAN) injection 4 mg  4 mg Intravenous Q6H PRN Alyssa Decamp, MD   4 mg at 07/22/23 2200   ondansetron (ZOFRAN-ODT) disintegrating tablet 8 mg  8 mg Oral Q8H PRN Alyssa Decamp, MD       Oral care mouth rinse  15 mL Mouth Rinse PRN Alyssa Decamp, MD       sodium chloride flush (NS) 0.9 % injection 3 mL  3 mL Intravenous Q12H Alyssa Decamp, MD   3 mL at 07/26/23 1000   sodium chloride flush (NS) 0.9 % injection 3 mL  3 mL Intravenous PRN Alyssa Decamp, MD       ticagrelor Marden Noble) tablet 90 mg  90 mg Oral BID Alyssa Decamp, MD   90 mg at 07/26/23  1029     Discharge Medications: Please see discharge summary for a list of discharge medications.  Relevant Imaging Results:  Relevant Lab Results:   Additional Information [id number]  Delilah Shan, LCSWA

## 2023-07-26 NOTE — Progress Notes (Signed)
RE: Alyssa James  Date of Birth: 04-06-1951  Date: 07/26/2023    To Whom It May Concern:   Please be advised that the above-named patient will require a short-term nursing home stay - anticipated 30 days or less for rehabilitation and strengthening. The plan is for return home.

## 2023-07-26 NOTE — Evaluation (Signed)
Occupational Therapy Evaluation Patient Details Name: Alyssa James MRN: [id number] DOB: 06/03/1951 Today's Date: 07/26/2023   History of Present Illness Pt is a 72 yo female admitted 12/6 for STEMI, 12/6 LHC with angiography, 12/9 Coronary stent via radial access. PMH: Anxiety, GERD, HLD, HTN, Raynaud disease   Clinical Impression   PTA, pt lives alone and reports typically completely Independent in all ADLs, IADLs, driving and mobility without AD. Pt presents now with deficits in strength, endurance, standing balance and cognition. Pt with flat affect and requires consistent sequencing cues to complete tasks. Overall,pt requires Min A for bathroom mobility with RW and up to Mod A for LB ADLs, including toileting hygiene today. Pt reports some family in the area that may be able to assist though feel pt will need postacute rehab stay prior to DC home to maximize independence and safety.        If plan is discharge home, recommend the following: A lot of help with walking and/or transfers;A lot of help with bathing/dressing/bathroom;Assistance with cooking/housework;Direct supervision/assist for medications management;Direct supervision/assist for financial management;Assist for transportation    Functional Status Assessment  Patient has had a recent decline in their functional status and demonstrates the ability to make significant improvements in function in a reasonable and predictable amount of time.  Equipment Recommendations  Other (comment);BSC/3in1;Tub/shower seat (RW)    Recommendations for Other Services       Precautions / Restrictions Precautions Precautions: Fall;Other (comment) Precaution Comments: Rt radial heart cath 12/6 and 12/9 Restrictions Weight Bearing Restrictions: No      Mobility Bed Mobility               General bed mobility comments: in recliner on entry    Transfers Overall transfer level: Needs assistance Equipment used: Rolling walker (2  wheels) Transfers: Sit to/from Stand Sit to Stand: Min assist           General transfer comment: Min A to stand from recliner, CGA from toilet as pt pulling on RW. Min A to turn to toilet after entering bathroom      Balance Overall balance assessment: Needs assistance Sitting-balance support: No upper extremity supported, Feet supported Sitting balance-Leahy Scale: Fair     Standing balance support: Bilateral upper extremity supported, During functional activity Standing balance-Leahy Scale: Poor                             ADL either performed or assessed with clinical judgement   ADL Overall ADL's : Needs assistance/impaired Eating/Feeding: Set up;Sitting   Grooming: Contact guard assist;Standing;Wash/dry hands Grooming Details (indicate cue type and reason): cues to initiate task after toileting Upper Body Bathing: Supervision/ safety;Sitting   Lower Body Bathing: Moderate assistance;Sitting/lateral leans;Sit to/from stand   Upper Body Dressing : Minimal assistance;Sitting   Lower Body Dressing: Moderate assistance;Sitting/lateral leans;Sit to/from stand   Toilet Transfer: Minimal assistance;Ambulation;Rolling walker (2 wheels) Toilet Transfer Details (indicate cue type and reason): consistent assist needed to manuever RW in BR, turn to toilet and out of BR. pt bumping RW into doorway, wall, etc though little effort noted to correct requiring OT assist Toileting- Clothing Manipulation and Hygiene: Moderate assistance;Sit to/from stand;Sitting/lateral lean Toileting - Clothing Manipulation Details (indicate cue type and reason): assist for clothing mgmt, increased time to attempt peri care though pt reported unable to do it so OT assisted in standing.     Functional mobility during ADLs: Minimal assistance;Rolling walker (2 wheels);Cueing  for sequencing;Cueing for safety       Vision Baseline Vision/History: 1 Wears glasses Ability to See in Adequate  Light: 0 Adequate Patient Visual Report: No change from baseline Vision Assessment?: No apparent visual deficits     Perception         Praxis         Pertinent Vitals/Pain Pain Assessment Pain Assessment: No/denies pain     Extremity/Trunk Assessment Upper Extremity Assessment Upper Extremity Assessment: Generalized weakness;Right hand dominant   Lower Extremity Assessment Lower Extremity Assessment: Defer to PT evaluation   Cervical / Trunk Assessment Cervical / Trunk Assessment: Normal   Communication Communication Communication: No apparent difficulties Cueing Techniques: Gestural cues;Verbal cues;Tactile cues   Cognition Arousal: Alert Behavior During Therapy: Flat affect Overall Cognitive Status: No family/caregiver present to determine baseline cognitive functioning                                 General Comments: very flat with depressed affect though responds appropriately to questions. slower processing, requires cues for sequencing/initiation and DME mgmt     General Comments  VSS on RA    Exercises     Shoulder Instructions      Home Living Family/patient expects to be discharged to:: Private residence Living Arrangements: Alone Available Help at Discharge: Family;Available PRN/intermittently Type of Home: Mobile home Home Access: Stairs to enter Entrance Stairs-Number of Steps: 3 at back Entrance Stairs-Rails: Right;Left Home Layout: One level     Bathroom Shower/Tub: Chief Strategy Officer: Standard     Home Equipment: None          Prior Functioning/Environment Prior Level of Function : Independent/Modified Independent;Driving             Mobility Comments: no AD ADLs Comments: Completely independent with ADLs, IADLs, grocery shopping        OT Problem List: Decreased strength;Decreased activity tolerance;Impaired balance (sitting and/or standing);Decreased cognition;Decreased safety  awareness;Decreased knowledge of use of DME or AE;Cardiopulmonary status limiting activity      OT Treatment/Interventions: Self-care/ADL training;Therapeutic exercise;Energy conservation;DME and/or AE instruction;Therapeutic activities;Patient/family education;Balance training    OT Goals(Current goals can be found in the care plan section) Acute Rehab OT Goals Patient Stated Goal: none stated during session (reports unsure if she could manage at home today) OT Goal Formulation: With patient Time For Goal Achievement: 08/09/23 Potential to Achieve Goals: Good  OT Frequency: Min 1X/week    Co-evaluation              AM-PAC OT "6 Clicks" Daily Activity     Outcome Measure Help from another person eating meals?: A Little Help from another person taking care of personal grooming?: A Little Help from another person toileting, which includes using toliet, bedpan, or urinal?: A Lot Help from another person bathing (including washing, rinsing, drying)?: A Lot Help from another person to put on and taking off regular upper body clothing?: A Little Help from another person to put on and taking off regular lower body clothing?: A Lot 6 Click Score: 15   End of Session Equipment Utilized During Treatment: Gait belt;Rolling walker (2 wheels) Nurse Communication: Mobility status  Activity Tolerance: Patient tolerated treatment well;Patient limited by fatigue Patient left: in chair;with call bell/phone within reach  OT Visit Diagnosis: Unsteadiness on feet (R26.81);Other abnormalities of gait and mobility (R26.89);Muscle weakness (generalized) (M62.81)  Time: 1610-9604 OT Time Calculation (min): 23 min Charges:  OT General Charges $OT Visit: 1 Visit OT Evaluation $OT Eval Moderate Complexity: 1 Mod  Bradd Canary, OTR/L Acute Rehab Services Office: [phone]   Lorre Munroe 07/26/2023, 12:32 PM

## 2023-07-26 NOTE — Progress Notes (Signed)
Rounding Note    Patient Name: Alyssa James Date of Encounter: 07/26/2023  [facility] Cardiologist: Yates Decamp, MD   Subjective   Patient is still weak. Has a cough- clear phlegm. Denies chest pain or SOB  Inpatient Medications    Scheduled Meds:  aspirin  81 mg Oral Daily   atorvastatin  80 mg Oral Daily   azithromycin  500 mg Oral Daily   Chlorhexidine Gluconate Cloth  6 each Topical Daily   enoxaparin (LOVENOX) injection  40 mg Subcutaneous QHS   losartan  25 mg Oral Daily   metoprolol succinate  25 mg Oral Daily   potassium chloride  40 mEq Oral Once   sodium chloride flush  3 mL Intravenous Q12H   ticagrelor  90 mg Oral BID   Continuous Infusions:  cefTRIAXone (ROCEPHIN)  IV Stopped (07/25/23 1232)   PRN Meds: acetaminophen, albuterol, ondansetron (ZOFRAN) IV, ondansetron, mouth rinse, sodium chloride flush   Vital Signs    Vitals:   07/26/23 0200 07/26/23 0300 07/26/23 0400 07/26/23 0847  BP: 114/69 132/66 (!) 110/53   Pulse: 70 81 66   Resp: (!) 24 (!) 26 20   Temp:  98.4 F (36.9 C)  98.2 F (36.8 C)  TempSrc:  Oral  Oral  SpO2: 96% 95% 98%   Weight:      Height:        Intake/Output Summary (Last 24 hours) at 07/26/2023 0906 Last data filed at 07/26/2023 0300 Gross per 24 hour  Intake 859.52 ml  Output 600 ml  Net 259.52 ml      07/24/2023    8:00 PM 07/22/2023    3:58 PM 03/30/2022    2:03 PM  Last 3 Weights  Weight (lbs) 125 lb 14.1 oz 125 lb 126 lb 1.7 oz  Weight (kg) 57.1 kg 56.7 kg 57.2 kg      Telemetry    NSR - Personally Reviewed  ECG    Dated 07/23/23. NSR with deep T wave inversion anteriorly. Prolonged QTc 509 msec.  - Personally Reviewed  Physical Exam   GEN: elderly WF weak, coughing   Neck: No JVD Cardiac: RRR, no murmurs, rubs, or gallops.  Respiratory: Clear to auscultation bilaterally. GI: Soft, nontender, non-distended  MS: No edema; No deformity. No radial site hematoma Neuro:  Nonfocal  Psych:  Normal affect   Labs    High Sensitivity Troponin:   Recent Labs  Lab 07/22/23 1600  TROPONINIHS >24,000*     Chemistry Recent Labs  Lab 07/22/23 1600 07/22/23 1607 07/23/23 0332 07/25/23 0727 07/25/23 0732 07/26/23 0434  NA 135   < > 135  --  134* 138  K 3.4*   < > 4.6  --  3.8 3.4*  CL 103   < > 105  --  103 107  CO2 19*  --  22  --  22 20*  GLUCOSE 144*   < > 101*  --  100* 89  BUN 24*   < > 23  --  13 16  CREATININE 0.87   < > 1.02*  --  0.81 0.92  CALCIUM 8.8*  --  8.5*  --  8.3* 8.4*  PROT 6.4*  --   --  5.8*  --   --   ALBUMIN 3.1*  --   --  2.8*  --   --   AST 144*  --   --  35  --   --   ALT 99*  --   --  46*  --   --   ALKPHOS 137*  --   --  105  --   --   BILITOT 0.4  --   --  0.9  --   --   GFRNONAA >60  --  58*  --  >60 >60  ANIONGAP 13  --  8  --  9 11   < > = values in this interval not displayed.    Lipids  Recent Labs  Lab 07/22/23 1600  CHOL 154  TRIG 86  HDL 39*  LDLCALC 98  CHOLHDL 3.9    Hematology Recent Labs  Lab 07/23/23 0332 07/25/23 0732 07/26/23 0434  WBC 14.1* 19.7* 17.4*  RBC 3.27* 3.42* 3.09*  HGB 9.6* 10.0* 9.0*  HCT 29.0* 30.3* 27.5*  MCV 88.7 88.6 89.0  MCH 29.4 29.2 29.1  MCHC 33.1 33.0 32.7  RDW 12.4 12.3 12.5  PLT 364 424* 386   Thyroid No results for input(s): "TSH", "FREET4" in the last 168 hours.  BNPNo results for input(s): "BNP", "PROBNP" in the last 168 hours.  DDimer No results for input(s): "DDIMER" in the last 168 hours.   Radiology    DG Chest Port 1V same Day  Result Date: 07/25/2023 CLINICAL DATA:  Pneumonia. EXAM: PORTABLE CHEST 1 VIEW COMPARISON:  July 19, 2023. FINDINGS: The heart size and mediastinal contours are within normal limits. Decreased right upper lobe opacity is noted suggesting improving pneumonia. The visualized skeletal structures are unremarkable. IMPRESSION: Decreased right upper lobe opacity suggesting improving pneumonia. Followup PA and lateral chest X-ray is recommended in  3-4 weeks following trial of antibiotic therapy to ensure resolution and exclude underlying malignancy. Electronically Signed   By: Lupita Raider M.D.   On: 07/25/2023 13:33   CARDIAC CATHETERIZATION  Result Date: 07/25/2023 Images from the original result were not included.   Ost RCA lesion is 30% stenosed. Coronary angioplasty 07/25/23 The LAD was revisualized, there is a focal inadequate stent expansion in the midsegment, successful balloon angioplasty with 2.5 x 8 mm emerge [place] at 20 atmospheric pressure with complete expansion achieved.  2.5 x 38 mm and a 2.5 x 16 mm Synergy stents placed in the proximal all the way to the mid segment on 07/22/2023 otherwise widely patent. RCA: Proximal segment focal 95% and a distal 99% stenosis.  Diffuse long segment disease in the proximal segment. Successful PTCA and stenting of the proximal lesion with a 3.5 x 32 mm Synergy XD at 16 atmospheric pressure and postdilated with a 3.5 x 15 mm [place] balloon at 20 atmospheric pressure.  Distal RCA was stented with a 3.0 x 12 mm Synergy XD DES at 16 atmospheric pressure.  Stenosis reduced from 95% to 0% and 99% to 0% respectively with maintenance of TIMI-3 flow. Recommendation: Patient can be discharged home tomorrow if stable with DAPT for 1 year, continue beta-blockers and ARB and high intensity statins.    Cardiac Studies   Coronary/Graft Acute MI Revascularization  CORONARY STENT INTERVENTION  LEFT HEART CATH AND CORONARY ANGIOGRAPHY   Conclusion  Left Heart Catheterization 07/22/23: Hemodynamic data: LV: 129/7, EDP 23 mmHg.  Ao 130/60, mean 91 mmHg.  No pressure gradient across the aortic valve.   Angiographic data: RCA: Very large caliber vessel and dominant vessel.  There is a ostial 30% stenosis.  Mid segment of the RV branch has a 90% stenosis.  Distal RCA is a focal 99% what appears to be an ulcerated stenosis. LM: Large vessel, smooth and  normal. LAD: Moderate caliber vessel, gives origin to small D1  and D2, D2 has a secondary branch.  D1 has ostial 90% and D2 is subtotally occluded and diffusely diseased with TIMI-3 flow.  LAD has mid segment tortuosity.  There is a long segment high-grade 99% stenosis extending from D1 all the way to the mid to distal segment with TIMI I flow. LCx: Moderate to large caliber vessel, gives origin to large OM1 and moderate-sized OM 2 which are tortuous with minimal disease.   Interventional data: Successful difficult procedure due to acute angulation and subtotally occluded LAD with acute bend in the midsegment, long 2.5 x 38 mm followed by a overlapping 2.5 x 16 mm Synergy XD DES stent implanted and postdilated with the 2.5 mm x 16 mm stent balloon at 14 atmospheric pressure throughout the stented segment.  Stenosis reduced to 0% with improvement in TIMI flow from 1-3.      Impression and recommendations: Patient will need DAPT for 1 year and aspirin indefinitely.  Needs aggressive therapy for hypertension and hyperlipidemia.  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     Ost RCA lesion is 30% stenosed.   Coronary angioplasty 07/25/23  The LAD was revisualized, there is a focal inadequate stent expansion in the midsegment, successful balloon angioplasty with 2.5 x 8 mm emerge [place] at 20 atmospheric pressure with complete expansion achieved.  2.5 x 38 mm and a 2.5 x 16 mm Synergy stents placed in the proximal all the way to the mid segment on 07/22/2023 otherwise widely patent. RCA: Proximal segment focal 95% and a distal 99% stenosis.  Diffuse long segment disease in the proximal segment. Successful PTCA and stenting of the proximal lesion with a 3.5 x 32 mm Synergy XD at 16 atmospheric pressure and postdilated with a 3.5 x 15 mm [place] balloon at 20 atmospheric pressure.  Distal RCA was stented with a 3.0 x 12 mm Synergy XD DES at 16 atmospheric pressure.  Stenosis reduced from 95% to 0% and 99% to 0%  respectively with maintenance of TIMI-3 flow.    Recommendation: Patient can be discharged home tomorrow if stable with DAPT for 1 year, continue beta-blockers and ARB and high intensity statins.    Patient Profile     72 y.o. female with HTN, HLD, high coronary calcium score admitted with acute anterior STEMI. Also recently diagnosed by PCP with RUL PNA. Last antibiotic dose on 12/5.   Assessment & Plan    Acute anterior STEMI. S/p emergent stenting of the LAD with DES x 2. Troponin > 24K. Ecg with resolution of ST elevation. Persistent TW inversion. EF 45-50% on Echo. On DAPT with ASA and Brilinta for one year. On Toprol and losartan. Had  staged PCI today of tandem lesions in the mid and distal RCA yesterday RUL PNA. Recently diagnosed as outpatient. Persistent cough. WBC elevated 19K. Will plan treating for CAP with IV Rocephin and Z pak. Repeat CXR showed some improvement in infiltrate.  WBC trending down Elevated LFTs on admit in setting of MI. Repeat liver panel showed improvement almost to normal.  Marked deconditioning due to #1 and #2. Will order PT consult.  HTN. Controlled LV dysfunction. Hopefully will improve with revascularization. On losartan and Toprol. Consider Sherryll Burger but cost may be an issue. Would repeat Echo as outpatient.  7.   HLD. On high dose statin     For questions or updates, please contact  [facility] Please consult [email or web address] for contact info under        Signed, Quantez Schnyder [place], MD  07/26/2023, 9:06 AM

## 2023-07-26 NOTE — Evaluation (Signed)
Physical Therapy Evaluation Patient Details Name: TOBY ANDROS MRN: [id number] DOB: 1950-10-15 Today's Date: 07/26/2023  History of Present Illness  Pt is a 72 yo female admitted 12/6 for STEMI, 12/6 LHC with angiography, 12/9 Coronary stent via radial access. PMH: Anxiety, GERD, HLD, HTN, Raynaud disease  Clinical Impression  Pt presenting with decreased mobility, activity tolerance, and decreased safety awareness. Pt reports living alone and being independent with ADLs and mobility PTA. Pt required minA to stand and ambulate. Pt reports not using AD to ambulate normally, when using RW pt requires constant cueing for maintaining upright posture within bounds of RW. Pt required constant cueing to avoid pushing/pulling with RUE due to heart cath through R radial access on 12/9. Pt will benefit from continued skilled acute physical therapy to maximize independence and safety. Patient will benefit from continued inpatient follow up therapy, <3 hours/day        If plan is discharge home, recommend the following: A little help with walking and/or transfers;A little help with bathing/dressing/bathroom;Assistance with cooking/housework;Assist for transportation;Help with stairs or ramp for entrance   Can travel by private vehicle   Yes    Equipment Recommendations None recommended by PT  Recommendations for Other Services       Functional Status Assessment Patient has had a recent decline in their functional status and demonstrates the ability to make significant improvements in function in a reasonable and predictable amount of time.     Precautions / Restrictions Precautions Precautions: Fall;Other (comment) Precaution Comments: Rt radial heart cath 12/6 and 12/9 Restrictions Weight Bearing Restrictions: No      Mobility  Bed Mobility               General bed mobility comments: in recliner on entry    Transfers Overall transfer level: Needs assistance Equipment used:  Rolling walker (2 wheels) Transfers: Sit to/from Stand Sit to Stand: Min assist           General transfer comment: Pt pulls on RW to elevate even with cueing to use LUE to push off of recliner, pt requires constant cueing to avoid pushing/pulling with RUE, minA to elevate x2 trials    Ambulation/Gait Ambulation/Gait assistance: Min assist Gait Distance (Feet): 112 Feet Assistive device: Rolling walker (2 wheels) Gait Pattern/deviations: Step-to pattern, Decreased step length - left, Narrow base of support, Drifts right/left, Trunk flexed   Gait velocity interpretation: <1.31 ft/sec, indicative of household ambulator   General Gait Details: Pt drags LLE, when cued to lift foot during swing phase pt began to circumduct to compensate, forward flexed into walker, uses hands and forearms on walker, drifts left nonreceptive to verbal cueing for drift, minA for management of RW when drifting, when turning pt far back from bounds of RW and very unstable. Ambulated 82ft required seated rest break then ambulated 128ft  Stairs            Wheelchair Mobility     Tilt Bed    Modified Rankin (Stroke Patients Only)       Balance Overall balance assessment: Needs assistance Sitting-balance support: No upper extremity supported, Feet supported Sitting balance-Leahy Scale: Fair     Standing balance support: Bilateral upper extremity supported, During functional activity Standing balance-Leahy Scale: Poor Standing balance comment: reliant on RW during standing activity                             Pertinent Vitals/Pain Pain Assessment Pain  Assessment: No/denies pain    Home Living Family/patient expects to be discharged to:: Private residence Living Arrangements: Alone Available Help at Discharge: Family;Available PRN/intermittently Type of Home: Mobile home Home Access: Stairs to enter Entrance Stairs-Rails: Doctor, general practice of Steps: 3 at back    Home Layout: One level Home Equipment: None      Prior Function Prior Level of Function : Independent/Modified Independent;Driving             Mobility Comments: no AD ADLs Comments: Completely independent with ADLs, IADLs, grocery shopping     Extremity/Trunk Assessment   Upper Extremity Assessment Upper Extremity Assessment: Defer to OT evaluation    Lower Extremity Assessment Lower Extremity Assessment: Overall WFL for tasks assessed    Cervical / Trunk Assessment Cervical / Trunk Assessment: Normal  Communication   Communication Communication: No apparent difficulties Cueing Techniques: Verbal cues;Tactile cues;Gestural cues  Cognition Arousal: Alert Behavior During Therapy: Flat affect Overall Cognitive Status: No family/caregiver present to determine baseline cognitive functioning                                 General Comments: flat affect with constant stimulation to elicit response. slow processing, requires cues for sequencing/initiation and DME mgmt        General Comments General comments (skin integrity, edema, etc.): VSS on RA    Exercises     Assessment/Plan    PT Assessment Patient needs continued PT services  PT Problem List Decreased strength;Decreased balance;Decreased mobility;Decreased activity tolerance;Decreased knowledge of use of DME;Decreased safety awareness;Decreased knowledge of precautions;Decreased cognition       PT Treatment Interventions Gait training;Stair training;Functional mobility training;Therapeutic activities;DME instruction;Therapeutic exercise;Balance training;Patient/family education    PT Goals (Current goals can be found in the Care Plan section)       Frequency Min 1X/week     Co-evaluation               AM-PAC PT "6 Clicks" Mobility  Outcome Measure Help needed turning from your back to your side while in a flat bed without using bedrails?: A Little Help needed moving from lying  on your back to sitting on the side of a flat bed without using bedrails?: A Little Help needed moving to and from a bed to a chair (including a wheelchair)?: A Little Help needed standing up from a chair using your arms (e.g., wheelchair or bedside chair)?: A Little Help needed to walk in hospital room?: A Lot Help needed climbing 3-5 steps with a railing? : A Lot 6 Click Score: 16    End of Session Equipment Utilized During Treatment: Gait belt Activity Tolerance: Patient tolerated treatment well Patient left: in chair;with call bell/phone within reach Nurse Communication: Mobility status PT Visit Diagnosis: Unsteadiness on feet (R26.81);Muscle weakness (generalized) (M62.81);Other abnormalities of gait and mobility (R26.89);Difficulty in walking, not elsewhere classified (R26.2)    Time: 1610-9604 PT Time Calculation (min) (ACUTE ONLY): 27 min   Charges:   PT Evaluation $PT Eval Moderate Complexity: 1 Mod PT Treatments $Gait Training: 8-22 mins PT General Charges $$ ACUTE PT VISIT: 1 Visit         Andrey Farmer. SPT Secure chat preferred   Darlin Drop 07/26/2023, 1:33 PM

## 2023-07-26 NOTE — Progress Notes (Addendum)
CARDIAC REHAB PHASE I   PRE:  Rate/Rhythm: 88 NSR  BP:  Sitting: 139/75      SpO2: 100 RA  MODE:  Ambulation: To the dor ft    POST:  Rate/Rhythm: 96 NSR  BP:  Sitting: 131/63      SpO2: 95-100 RA  Pt ambulated to the door with RW and contact guard assistance from gait belt. Pt needed x2 attempts to stand with minimal assistance. Once standing pt ambulated with small steps to the door, and needed moderate assistance to help her turn around and ambulate to the bed. Pt reports her legs felt heavy but denies having chest pain and other unusual symptoms. Recommend PT consult.  Pt was educated on received information on HH diet, exercise, and CRP2.    Faustino Congress  MS, ACSM-CEP 10:01 AM 07/26/2023    Service time is from 0918 to 1000.

## 2023-07-26 NOTE — TOC Initial Note (Addendum)
Transition of Care [facility]) - Initial/Assessment Note    Patient Details  Name: Alyssa James MRN: [id number] Date of Birth: 09-10-1950  Transition [facility]) CM/SW Contact:    Delilah Shan, LCSWA Phone Number: 07/26/2023, 3:14 PM  Clinical Narrative:                  CSW received consult for possible SNF placement at time of discharge. CSW spoke with patient regarding PT recommendation of SNF placement at time of discharge. Patient reports PTA she comes from home alone. Patient expressed understanding of PT recommendation and is agreeable to SNF placement at time of discharge.Patient gave CSW permission to fax out initial referral for possible SNF placement. CSW discussed insurance authorization process and will provide Medicare SNF ratings list with accepted SNF bed offers when available.  No further questions reported at this time. Patients passr pending.CSW to continue to follow and assist with discharge planning needs.   Expected Discharge Plan: Skilled Nursing Facility Barriers to Discharge: Continued Medical Work up   Patient Goals and CMS Choice Patient states their goals for this hospitalization and ongoing recovery are:: SNF CMS Medicare.gov Compare Post Acute Care list provided to:: Patient Choice offered to / list presented to : Patient      Expected Discharge Plan and Services In-house Referral: Clinical Social Work     Living arrangements for the past 2 months: Single Family Home                                      Prior Living Arrangements/Services Living arrangements for the past 2 months: Single Family Home Lives with:: Self Patient language and need for interpreter reviewed:: Yes Do you feel safe going back to the place where you live?: No   SNF  Need for Family Participation in Patient Care: Yes (Comment) Care giver support system in place?: Yes (comment)   Criminal Activity/Legal Involvement Pertinent to Current Situation/Hospitalization: No  - Comment as needed  Activities of Daily Living   ADL Screening (condition at time of admission) Independently performs ADLs?: Yes (appropriate for developmental age) Is the patient deaf or have difficulty hearing?: No Does the patient have difficulty seeing, even when wearing glasses/contacts?: No Does the patient have difficulty concentrating, remembering, or making decisions?: No  Permission Sought/Granted Permission sought to share information with : Case Manager, Magazine features editor, Family Supports Permission granted to share information with : Yes, Verbal Permission Granted  Share Information with NAME: Junious Dresser  Permission granted to share info w AGENCY: SNF  Permission granted to share info w Relationship: Sister  Permission granted to share info w Contact Information: Junious Dresser [phone]  Emotional Assessment Appearance:: Appears stated age Attitude/Demeanor/Rapport: Gracious Affect (typically observed): Calm Orientation: : Oriented to Self, Oriented to Place, Oriented to  Time, Oriented to Situation Alcohol / Substance Use: Not Applicable Psych Involvement: No (comment)  Admission diagnosis:  Acute anteroseptal myocardial infarction [facility]) [I21.09] Patient Active Problem List   Diagnosis Date Noted   Acute anteroseptal myocardial infarction (HCC) 07/22/2023   Snake bite 12/30/2013   Nausea and vomiting 12/30/2013   Hypokalemia 12/30/2013   Hyperlipidemia    PCP:  Georgann Housekeeper, MD Pharmacy:   [facility] [address] [place] - [address] [facility] [place] [address] Phone: [phone] Fax: [phone]  [facility] [address] [place] [address] Phone: [phone] Fax: [phone]  [facility] [address] [place] [address] Phone: [phone] Fax: [phone]     Social Determinants of Health (SDOH) Social  History: SDOH Screenings   Food Insecurity: No Food Insecurity (07/22/2023)  Housing: Low Risk  (07/22/2023)  Transportation Needs: No Transportation Needs (07/22/2023)  Utilities: Not At Risk (07/22/2023)  Tobacco Use: Low Risk  (07/23/2023)   SDOH Interventions:     Readmission Risk Interventions     No data to display

## 2023-07-26 NOTE — Plan of Care (Signed)
  Problem: Clinical Measurements: Goal: Ability to maintain clinical measurements within normal limits will improve Outcome: Progressing Goal: Cardiovascular complication will be avoided Outcome: Progressing   Problem: Activity: Goal: Risk for activity intolerance will decrease Outcome: Progressing   Problem: Nutrition: Goal: Adequate nutrition will be maintained Outcome: Progressing   Problem: Coping: Goal: Level of anxiety will decrease Outcome: Progressing   Problem: Elimination: Goal: Will not experience complications related to bowel motility Outcome: Progressing Goal: Will not experience complications related to urinary retention Outcome: Progressing   Problem: Pain Management: Goal: General experience of comfort will improve Outcome: Progressing   Problem: Safety: Goal: Ability to remain free from injury will improve Outcome: Progressing   Problem: Skin Integrity: Goal: Risk for impaired skin integrity will decrease Outcome: Progressing   Problem: Activity: Goal: Ability to return to baseline activity level will improve Outcome: Progressing   Problem: Cardiovascular: Goal: Ability to achieve and maintain adequate cardiovascular perfusion will improve Outcome: Progressing Goal: Vascular access site(s) Level 0-1 will be maintained Outcome: Progressing

## 2023-07-27 ENCOUNTER — Other Ambulatory Visit (HOSPITAL_COMMUNITY): Payer: Self-pay

## 2023-07-27 DIAGNOSIS — J189 Pneumonia, unspecified organism: Secondary | ICD-10-CM | POA: Insufficient documentation

## 2023-07-27 DIAGNOSIS — R7989 Other specified abnormal findings of blood chemistry: Secondary | ICD-10-CM | POA: Insufficient documentation

## 2023-07-27 DIAGNOSIS — I10 Essential (primary) hypertension: Secondary | ICD-10-CM | POA: Insufficient documentation

## 2023-07-27 DIAGNOSIS — I255 Ischemic cardiomyopathy: Secondary | ICD-10-CM | POA: Insufficient documentation

## 2023-07-27 LAB — BASIC METABOLIC PANEL
Anion gap: 6 (ref 5–15)
BUN: 16 mg/dL (ref 8–23)
CO2: 20 mmol/L — ABNORMAL LOW (ref 22–32)
Calcium: 8.2 mg/dL — ABNORMAL LOW (ref 8.9–10.3)
Chloride: 108 mmol/L (ref 98–111)
Creatinine, Ser: 0.74 mg/dL (ref 0.44–1.00)
GFR, Estimated: >60 mL/min (ref >60)
Glucose, Bld: 97 mg/dL (ref 70–99)
Potassium: 3.9 mmol/L (ref 3.5–5.1)
Sodium: 134 mmol/L — ABNORMAL LOW (ref 135–145)

## 2023-07-27 LAB — CBC
HCT: 27.5 % — ABNORMAL LOW (ref 36.0–46.0)
Hemoglobin: 8.9 g/dL — ABNORMAL LOW (ref 12.0–15.0)
MCH: 28.9 pg (ref 26.0–34.0)
MCHC: 32.4 g/dL (ref 30.0–36.0)
MCV: 89.3 fL (ref 80.0–100.0)
Platelets: 374 10*3/uL (ref 150–400)
RBC: 3.08 MIL/uL — ABNORMAL LOW (ref 3.87–5.11)
RDW: 12.8 % (ref 11.5–15.5)
WBC: 11.7 10*3/uL — ABNORMAL HIGH (ref 4.0–10.5)
nRBC: 0 % (ref 0.0–0.2)

## 2023-07-27 MED ORDER — LOSARTAN POTASSIUM 25 MG PO TABS
25.0000 mg | ORAL_TABLET | Freq: Every day | ORAL | 1 refills | Status: DC
  Filled 2023-07-27: qty 30, 30d supply, fill #0

## 2023-07-27 MED ORDER — ATORVASTATIN CALCIUM 80 MG PO TABS
80.0000 mg | ORAL_TABLET | Freq: Every day | ORAL | 1 refills | Status: DC
  Filled 2023-07-27: qty 30, 30d supply, fill #0

## 2023-07-27 MED ORDER — CEFUROXIME AXETIL 500 MG PO TABS
500.0000 mg | ORAL_TABLET | Freq: Two times a day (BID) | ORAL | Status: DC
  Filled 2023-07-27: qty 1

## 2023-07-27 MED ORDER — NITROGLYCERIN 0.4 MG SL SUBL
0.4000 mg | SUBLINGUAL_TABLET | SUBLINGUAL | 2 refills | Status: AC | PRN
  Filled 2023-07-27: qty 25, 7d supply, fill #0

## 2023-07-27 MED ORDER — TICAGRELOR 90 MG PO TABS
90.0000 mg | ORAL_TABLET | Freq: Two times a day (BID) | ORAL | 2 refills | Status: DC
  Filled 2023-07-27: qty 60, 30d supply, fill #0

## 2023-07-27 MED ORDER — CEFUROXIME AXETIL 500 MG PO TABS
500.0000 mg | ORAL_TABLET | Freq: Two times a day (BID) | ORAL | 0 refills | Status: AC
  Filled 2023-07-27: qty 5, 3d supply, fill #0

## 2023-07-27 MED ORDER — METOPROLOL SUCCINATE ER 25 MG PO TB24
25.0000 mg | ORAL_TABLET | Freq: Every day | ORAL | 1 refills | Status: DC
  Filled 2023-07-27: qty 90, 90d supply, fill #0

## 2023-07-27 MED ORDER — CEFUROXIME AXETIL 500 MG PO TABS
500.0000 mg | ORAL_TABLET | Freq: Two times a day (BID) | ORAL | Status: DC
  Administered 2023-07-27: 500 mg via ORAL
  Filled 2023-07-27 (×2): qty 1

## 2023-07-27 NOTE — Discharge Summary (Signed)
Discharge Summary    Patient ID: Alyssa James MRN: [id number]; DOB: [birth year]  Admit date: 07/22/2023 Discharge date: 07/27/2023  PCP:  Georgann Housekeeper, MD    [facility] Providers Cardiologist:  Yates Decamp, MD      Discharge Diagnoses    Principal Problem:   Acute anteroseptal myocardial infarction [facility]) Active Problems:   Hyperlipidemia   Hypertension   Ischemic cardiomyopathy   Pneumonia   Elevated LFTs  Diagnostic Studies/Procedures    Left Heart Catheterization 07/22/23: Hemodynamic data: LV: 129/7, EDP 23 mmHg.  Ao 130/60, mean 91 mmHg.  No pressure gradient across the aortic valve.   Angiographic data: RCA: Very large caliber vessel and dominant vessel.  There is a ostial 30% stenosis.  Mid segment of the RV branch has a 90% stenosis.  Distal RCA is a focal 99% what appears to be an ulcerated stenosis. LM: Large vessel, smooth and normal. LAD: Moderate caliber vessel, gives origin to small D1 and D2, D2 has a secondary branch.  D1 has ostial 90% and D2 is subtotally occluded and diffusely diseased with TIMI-3 flow.  LAD has mid segment tortuosity.  There is a long segment high-grade 99% stenosis extending from D1 all the way to the mid to distal segment with TIMI I flow. LCx: Moderate to large caliber vessel, gives origin to large OM1 and moderate-sized OM 2 which are tortuous with minimal disease.   Interventional data: Successful difficult procedure due to acute angulation and subtotally occluded LAD with acute bend in the midsegment, long 2.5 x 38 mm followed by a overlapping 2.5 x 16 mm Synergy XD DES stent implanted and postdilated with the 2.5 mm x 16 mm stent balloon at 14 atmospheric pressure throughout the stented segment.  Stenosis reduced to 0% with improvement in TIMI flow from 1-3.      Impression and recommendations: Patient will need DAPT for 1 year and aspirin indefinitely.  Needs aggressive therapy for hypertension and hyperlipidemia.  She  has high-grade stenosis and focal lesions in the RCA that needs angioplasty prior to discharge home.  Coronary angioplasty 07/25/23   The LAD was revisualized, there is a focal inadequate stent expansion in the midsegment, successful balloon angioplasty with 2.5 x 8 mm emerge [place] at 20 atmospheric pressure with complete expansion achieved.  2.5 x 38 mm and a 2.5 x 16 mm Synergy stents placed in the proximal all the way to the mid segment on 07/22/2023 otherwise widely patent. RCA: Proximal segment focal 95% and a distal 99% stenosis.  Diffuse long segment disease in the proximal segment. Successful PTCA and stenting of the proximal lesion with a 3.5 x 32 mm Synergy XD at 16 atmospheric pressure and postdilated with a 3.5 x 15 mm [place] balloon at 20 atmospheric pressure.  Distal RCA was stented with a 3.0 x 12 mm Synergy XD DES at 16 atmospheric pressure.  Stenosis reduced from 95% to 0% and 99% to 0% respectively with maintenance of TIMI-3 flow.    Recommendation: Patient can be discharged home tomorrow if stable with DAPT for 1 year, continue beta-blockers and ARB and high intensity statins.   Echo: 07/23/2023  IMPRESSIONS     1. Septal , apical, distal anterior wall and inferior apical hypokinesis  consistent with multivessel CAD. Left ventricular ejection fraction, by  estimation, is 45 to 50%. The left ventricle has mildly decreased  function. The left ventricle demonstrates  regional wall motion abnormalities (see scoring diagram/findings for  description). The left ventricular  internal cavity size was mildly  dilated. Left ventricular diastolic parameters were normal.   2. Right ventricular systolic function is normal. The right ventricular  size is normal.   3. The mitral valve is abnormal. Trivial mitral valve regurgitation. No  evidence of mitral stenosis.   4. The aortic valve is tricuspid. There is moderate calcification of the  aortic valve. There is moderate thickening of the  aortic valve. Aortic  valve regurgitation is mild. Aortic valve sclerosis is present, with no  evidence of aortic valve stenosis.   5. The inferior vena cava is normal in size with greater than 50%  respiratory variability, suggesting right atrial pressure of 3 mmHg.   FINDINGS   Left Ventricle: Septal , apical, distal anterior wall and inferior apical  hypokinesis consistent with multivessel CAD. Left ventricular ejection  fraction, by estimation, is 45 to 50%. The left ventricle has mildly  decreased function. The left ventricle  demonstrates regional wall motion abnormalities. The left ventricular  internal cavity size was mildly dilated. There is no left ventricular  hypertrophy. Left ventricular diastolic parameters were normal.   Right Ventricle: The right ventricular size is normal. No increase in  right ventricular wall thickness. Right ventricular systolic function is  normal.   Left Atrium: Left atrial size was normal in size.   Right Atrium: Right atrial size was normal in size.   Pericardium: There is no evidence of pericardial effusion.   Mitral Valve: The mitral valve is abnormal. There is mild thickening of  the mitral valve leaflet(s). Trivial mitral valve regurgitation. No  evidence of mitral valve stenosis.   Tricuspid Valve: The tricuspid valve is normal in structure. Tricuspid  valve regurgitation is trivial. No evidence of tricuspid stenosis.   Aortic Valve: The aortic valve is tricuspid. There is moderate  calcification of the aortic valve. There is moderate thickening of the  aortic valve. Aortic valve regurgitation is mild. Aortic valve sclerosis  is present, with no evidence of aortic valve  stenosis. Aortic valve mean gradient measures 5.5 mmHg. Aortic valve peak  gradient measures 9.7 mmHg. Aortic valve area, by VTI measures 2.11 cm.   Pulmonic Valve: The pulmonic valve was normal in structure. Pulmonic valve  regurgitation is mild. No evidence of  pulmonic stenosis.   Aorta: The aortic root is normal in size and structure.   Venous: The inferior vena cava is normal in size with greater than 50%  respiratory variability, suggesting right atrial pressure of 3 mmHg.   IAS/Shunts: No atrial level shunt detected by color flow Doppler. _____________   History of Present Illness     Alyssa James is a 72 y.o. female with chest pain who is being seen 07/22/2023 for the evaluation of STEMI.  Patient with hypertension, hypercholesterolemia, coronary calcium score in the 88th percentile 2 years ago presenting via EMS, patient had severe chest pain associated with radiation to the back and also left arm the day prior to admission that lasted for cased 30 minutes to an hour and was lingering for longer but mild, patient ignored this.  She eventually slept felt better.   The morning of admission she re-developed chest pain and again later in the afternoon. EKG reviewed ST elevation however no Q waves noted V1-V3 suggestive of completed infarct with deep T wave inversion.  With still ongoing chest pain, with activation of STEMI she was emergently will be taken to the cardiac catheterization lab.   Hospital Course   Anterior STEMI --  Underwent cath 12/6 with successful PCI to occluded LAD with overlapping DES x 2.  Troponin greater than 24,000.  EKG showed resolution of ST elevation.  Did have persistent T wave inversion.  Underwent staged intervention with successful PCI/DES x 2 of proximal RCA and distal RCA. Seen by cardiac rehab. -- Recommendations for DAPT with aspirin/Brilinta for at least 1 year. -- Continue aspirin, Brilinta, atorvastatin 80 mg daily, metoprolol XL 25 mg daily, losartan 25 mg daily   Right upper lobe pneumonia -- Diagnosed as an outpatient, persistent cough.  Did have elevated white count of 19,000 and now trending down. --Transition to Ceftin 500 mg twice daily to be completed on 12/13 with PM dose.   Elevated LFTs -- In  the setting of acute MI, now improved   Hypertension -- Blood pressure somewhat marginal -- Continue Toprol XL 25 mg daily, losartan 25 mg daily   HFmrEF ICM  -- LVEF of 45 to 50%, septal, apical, distal anterior wall and inferior hypokinesis, normal RV, trivial MR -- GDMT: As above continue Toprol XL 25 mg daily, losartan 25 mg daily   Hyperlipidemia -- LDL 98, HDL 39, LP(a) 91 -- Continue atorvastatin 80 mg daily -- will need FLP/LFTs in 8 weeks    Deconditioned -- seen by PT/OT with recommendations for HHPT/OT at discharge as she does have family who are able to stay with her at [place]  Did the patient have an acute coronary syndrome (MI, NSTEMI, STEMI, etc) this admission?:  Yes                               AHA/ACC ACS Clinical Performance & Quality Measures: Aspirin prescribed? - Yes ADP Receptor Inhibitor (Plavix/Clopidogrel, Brilinta/Ticagrelor or Effient/Prasugrel) prescribed (includes medically managed patients)? - Yes Beta Blocker prescribed? - Yes High Intensity Statin (Lipitor 40-80mg  or Crestor 20-40mg ) prescribed? - Yes EF assessed during THIS hospitalization? - Yes For EF <40%, was ACEI/ARB prescribed? - Not Applicable (EF >/= 40%) For EF <40%, Aldosterone Antagonist (Spironolactone or Eplerenone) prescribed? - Not Applicable (EF >/= 40%) Cardiac Rehab Phase II ordered (including medically managed patients)? - Yes   The patient will be scheduled for a TOC follow up appointment in 10-14 days.  A message has been sent to the [facility] and Scheduling Pool at the office where the patient should be seen for follow up.  _____________  Discharge Vitals Blood pressure 118/62, pulse 69, temperature 97.7 F (36.5 C), temperature source Oral, resp. rate 18, height 5\' 1"  (1.549 m), weight 54.1 kg, SpO2 97%.  Filed Weights   07/22/23 1558 07/24/23 2000 07/26/23 2249  Weight: 56.7 kg 57.1 kg 54.1 kg    Labs & Radiologic Studies    CBC Recent Labs    07/26/23 0434  07/27/23 0455  WBC 17.4* 11.7*  HGB 9.0* 8.9*  HCT 27.5* 27.5*  MCV 89.0 89.3  PLT 386 374   Basic Metabolic Panel Recent Labs    82/95/62 0434 07/27/23 0455  NA 138 134*  K 3.4* 3.9  CL 107 108  CO2 20* 20*  GLUCOSE 89 97  BUN 16 16  CREATININE 0.92 0.74  CALCIUM 8.4* 8.2*   Liver Function Tests Recent Labs    07/25/23 0727  AST 35  ALT 46*  ALKPHOS 105  BILITOT 0.9  PROT 5.8*  ALBUMIN 2.8*   No results for input(s): "LIPASE", "AMYLASE" in the last 72 hours. High Sensitivity Troponin:  Recent Labs  Lab 07/22/23 1600  TROPONINIHS >24,000*    BNP Invalid input(s): "POCBNP" D-Dimer No results for input(s): "DDIMER" in the last 72 hours. Hemoglobin A1C No results for input(s): "HGBA1C" in the last 72 hours. Fasting Lipid Panel No results for input(s): "CHOL", "HDL", "LDLCALC", "TRIG", "CHOLHDL", "LDLDIRECT" in the last 72 hours. Thyroid Function Tests No results for input(s): "TSH", "T4TOTAL", "T3FREE", "THYROIDAB" in the last 72 hours.  Invalid input(s): "FREET3" _____________  [facility] Chest Port 1V same Day  Result Date: 07/25/2023 CLINICAL DATA:  Pneumonia. EXAM: PORTABLE CHEST 1 VIEW COMPARISON:  July 19, 2023. FINDINGS: The heart size and mediastinal contours are within normal limits. Decreased right upper lobe opacity is noted suggesting improving pneumonia. The visualized skeletal structures are unremarkable. IMPRESSION: Decreased right upper lobe opacity suggesting improving pneumonia. Followup PA and lateral chest X-ray is recommended in 3-4 weeks following trial of antibiotic therapy to ensure resolution and exclude underlying malignancy. Electronically Signed   By: Lupita Raider M.D.   On: 07/25/2023 13:33   CARDIAC CATHETERIZATION  Result Date: 07/25/2023 Images from the original result were not included.   Ost RCA lesion is 30% stenosed. Coronary angioplasty 07/25/23 The LAD was revisualized, there is a focal inadequate stent expansion in the  midsegment, successful balloon angioplasty with 2.5 x 8 mm emerge [place] at 20 atmospheric pressure with complete expansion achieved.  2.5 x 38 mm and a 2.5 x 16 mm Synergy stents placed in the proximal all the way to the mid segment on 07/22/2023 otherwise widely patent. RCA: Proximal segment focal 95% and a distal 99% stenosis.  Diffuse long segment disease in the proximal segment. Successful PTCA and stenting of the proximal lesion with a 3.5 x 32 mm Synergy XD at 16 atmospheric pressure and postdilated with a 3.5 x 15 mm [place] balloon at 20 atmospheric pressure.  Distal RCA was stented with a 3.0 x 12 mm Synergy XD DES at 16 atmospheric pressure.  Stenosis reduced from 95% to 0% and 99% to 0% respectively with maintenance of TIMI-3 flow. Recommendation: Patient can be discharged home tomorrow if stable with DAPT for 1 year, continue beta-blockers and ARB and high intensity statins.   ECHOCARDIOGRAM COMPLETE  Result Date: 07/23/2023    ECHOCARDIOGRAM REPORT   Patient Name:   Alyssa James Date of Exam: 07/23/2023 Medical Rec #:  [id number]      Height:       61.0 in Accession #:    [id number]     Weight:       125.0 lb Date of Birth:  May 06, 1951      BSA:          1.547 m Patient Age:    72 years       BP:           117/60 mmHg Patient Gender: F              HR:           63 bpm. Exam Location:  Inpatient Procedure: 2D Echo, Color Doppler, Cardiac Doppler and Strain Analysis Indications:    Sepsis  History:        Patient has no prior history of Echocardiogram examinations.                 STEMI; Risk Factors:Hypertension and Dyslipidemia. Raynaud                 Disease.  Sonographer:    Raeford Razor RDCS  Referring Phys: Yates Decamp IMPRESSIONS  1. Septal , apical, distal anterior wall and inferior apical hypokinesis consistent with multivessel CAD. Left ventricular ejection fraction, by estimation, is 45 to 50%. The left ventricle has mildly decreased function. The left ventricle demonstrates regional wall motion  abnormalities (see scoring diagram/findings for description). The left ventricular internal cavity size was mildly dilated. Left ventricular diastolic parameters were normal.  2. Right ventricular systolic function is normal. The right ventricular size is normal.  3. The mitral valve is abnormal. Trivial mitral valve regurgitation. No evidence of mitral stenosis.  4. The aortic valve is tricuspid. There is moderate calcification of the aortic valve. There is moderate thickening of the aortic valve. Aortic valve regurgitation is mild. Aortic valve sclerosis is present, with no evidence of aortic valve stenosis.  5. The inferior vena cava is normal in size with greater than 50% respiratory variability, suggesting right atrial pressure of 3 mmHg. FINDINGS  Left Ventricle: Septal , apical, distal anterior wall and inferior apical hypokinesis consistent with multivessel CAD. Left ventricular ejection fraction, by estimation, is 45 to 50%. The left ventricle has mildly decreased function. The left ventricle demonstrates regional wall motion abnormalities. The left ventricular internal cavity size was mildly dilated. There is no left ventricular hypertrophy. Left ventricular diastolic parameters were normal. Right Ventricle: The right ventricular size is normal. No increase in right ventricular wall thickness. Right ventricular systolic function is normal. Left Atrium: Left atrial size was normal in size. Right Atrium: Right atrial size was normal in size. Pericardium: There is no evidence of pericardial effusion. Mitral Valve: The mitral valve is abnormal. There is mild thickening of the mitral valve leaflet(s). Trivial mitral valve regurgitation. No evidence of mitral valve stenosis. Tricuspid Valve: The tricuspid valve is normal in structure. Tricuspid valve regurgitation is trivial. No evidence of tricuspid stenosis. Aortic Valve: The aortic valve is tricuspid. There is moderate calcification of the aortic valve. There  is moderate thickening of the aortic valve. Aortic valve regurgitation is mild. Aortic valve sclerosis is present, with no evidence of aortic valve stenosis. Aortic valve mean gradient measures 5.5 mmHg. Aortic valve peak gradient measures 9.7 mmHg. Aortic valve area, by VTI measures 2.11 cm. Pulmonic Valve: The pulmonic valve was normal in structure. Pulmonic valve regurgitation is mild. No evidence of pulmonic stenosis. Aorta: The aortic root is normal in size and structure. Venous: The inferior vena cava is normal in size with greater than 50% respiratory variability, suggesting right atrial pressure of 3 mmHg. IAS/Shunts: No atrial level shunt detected by color flow Doppler.  LEFT VENTRICLE PLAX 2D LVIDd:         3.40 cm   Diastology LVIDs:         2.40 cm   LV e' medial:    7.94 cm/s LV PW:         0.90 cm   LV E/e' medial:  10.2 LV IVS:        1.10 cm   LV e' lateral:   6.09 cm/s LVOT diam:     1.80 cm   LV E/e' lateral: 13.3 LV SV:         73 LV SV Index:   47 LVOT Area:     2.54 cm  RIGHT VENTRICLE             IVC RV Basal diam:  2.50 cm     IVC diam: 1.30 cm RV S prime:     14.90 cm/s TAPSE (M-mode): 1.6  cm LEFT ATRIUM             Index        RIGHT ATRIUM          Index LA diam:        3.70 cm 2.39 cm/m   RA Area:     9.04 cm LA Vol (A2C):   43.8 ml 28.32 ml/m  RA Volume:   16.90 ml 10.93 ml/m LA Vol (A4C):   45.9 ml 29.67 ml/m LA Biplane Vol: 48.7 ml 31.48 ml/m  AORTIC VALVE AV Area (Vmax):    1.78 cm AV Area (Vmean):   1.96 cm AV Area (VTI):     2.11 cm AV Vmax:           156.00 cm/s AV Vmean:          105.400 cm/s AV VTI:            0.347 m AV Peak Grad:      9.7 mmHg AV Mean Grad:      5.5 mmHg LVOT Vmax:         109.00 cm/s LVOT Vmean:        81.100 cm/s LVOT VTI:          0.287 m LVOT/AV VTI ratio: 0.83  AORTA Ao Root diam: 2.60 cm Ao Asc diam:  3.10 cm MITRAL VALVE               TRICUSPID VALVE MV Area (PHT): 4.63 cm    TR Peak grad:   13.5 mmHg MV Decel Time: 164 msec    TR Vmax:         184.00 cm/s MV E velocity: 81.10 cm/s MV A velocity: 88.40 cm/s  SHUNTS MV E/A ratio:  0.92        Systemic VTI:  0.29 m                            Systemic Diam: 1.80 cm Charlton Haws MD Electronically signed by Charlton Haws MD Signature Date/Time: 07/23/2023/2:15:24 PM    Final    CARDIAC CATHETERIZATION  Result Date: 07/22/2023 Images from the original result were not included. Left Heart Catheterization 07/22/23: Hemodynamic data: LV: 129/7, EDP 23 mmHg.  Ao 130/60, mean 91 mmHg.  No pressure gradient across the aortic valve. Angiographic data: RCA: Very large caliber vessel and dominant vessel.  There is a ostial 30% stenosis.  Mid segment of the RV branch has a 90% stenosis.  Distal RCA is a focal 99% what appears to be an ulcerated stenosis. LM: Large vessel, smooth and normal. LAD: Moderate caliber vessel, gives origin to small D1 and D2, D2 has a secondary branch.  D1 has ostial 90% and D2 is subtotally occluded and diffusely diseased with TIMI-3 flow.  LAD has mid segment tortuosity.  There is a long segment high-grade 99% stenosis extending from D1 all the way to the mid to distal segment with TIMI I flow. LCx: Moderate to large caliber vessel, gives origin to large OM1 and moderate-sized OM 2 which are tortuous with minimal disease. Interventional data: Successful difficult procedure due to acute angulation and subtotally occluded LAD with acute bend in the midsegment, long 2.5 x 38 mm followed by a overlapping 2.5 x 16 mm Synergy XD DES stent implanted and postdilated with the 2.5 mm x 16 mm stent balloon at 14 atmospheric pressure throughout the stented segment.  Stenosis reduced to 0% with  improvement in TIMI flow from 1-3. Impression and recommendations: Patient will need DAPT for 1 year and aspirin indefinitely.  Needs aggressive therapy for hypertension and hyperlipidemia.  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   DG Chest 2  View  Result Date: 07/20/2023 CLINICAL DATA:  Bronchitis, fever, coughing EXAM: CHEST - 2 VIEW COMPARISON:  07/09/2020 FINDINGS: Anterior right upper lobe airspace disease, new since previous. Heart size and mediastinal contours are within normal limits. Aortic Atherosclerosis (ICD10-170.0). No effusion. Visualized bones unremarkable. IMPRESSION: Right upper lobe pneumonia. Electronically Signed   By: Corlis Leak M.D.   On: 07/20/2023 08:58   Disposition   Pt is being discharged home today in good condition.  Follow-up Plans & Appointments     Discharge Instructions     Amb Referral to Cardiac Rehabilitation   Complete by: As directed    [facility]   Diagnosis:  Coronary Stents STEMI     After initial evaluation and assessments completed: Virtual Based Care may be provided alone or in conjunction with Phase 2 Cardiac Rehab based on patient barriers.: Yes   Intensive Cardiac Rehabilitation (ICR) [facility] location only OR Traditional Cardiac Rehabilitation (TCR) *If criteria for ICR are not met will enroll in TCR [facility] only): Yes   Call MD for:  difficulty breathing, headache or visual disturbances   Complete by: As directed    Call MD for:  persistant dizziness or light-headedness   Complete by: As directed    Call MD for:  redness, tenderness, or signs of infection (pain, swelling, redness, odor or green/yellow discharge around incision site)   Complete by: As directed    Diet - low sodium heart healthy   Complete by: As directed    Discharge instructions   Complete by: As directed    Radial Site Care Refer to this sheet in the next few weeks. These instructions provide you with information on caring for yourself after your procedure. Your caregiver may also give you more specific instructions. Your treatment has been planned according to current medical practices, but problems sometimes occur. Call your caregiver if you have any problems or questions after your procedure. HOME CARE  INSTRUCTIONS You may shower the day after the procedure. Remove the bandage (dressing) and gently wash the site with plain soap and water. Gently pat the site dry.  Do not apply powder or lotion to the site.  Do not submerge the affected site in water for 3 to 5 days.  Inspect the site at least twice daily.  Do not flex or bend the affected arm for 24 hours.  No lifting over 5 pounds (2.3 kg) for 5 days after your procedure.  Do not drive home if you are discharged the same day of the procedure. Have someone else drive you.  You may drive 24 hours after the procedure unless otherwise instructed by your caregiver.  What to expect: Any bruising will usually fade within 1 to 2 weeks.  Blood that collects in the tissue (hematoma) may be painful to the touch. It should usually decrease in size and tenderness within 1 to 2 weeks.  SEEK IMMEDIATE MEDICAL CARE IF: You have unusual pain at the radial site.  You have redness, warmth, swelling, or pain at the radial site.  You have drainage (other than a small amount of blood on the dressing).  You have chills.  You have a fever or persistent symptoms for more than  72 hours.  You have a fever and your symptoms suddenly get worse.  Your arm becomes pale, cool, tingly, or numb.  You have heavy bleeding from the site. Hold pressure on the site.   PLEASE DO NOT MISS ANY DOSES OF YOUR BRILINTA!!!!! Also keep a log of you blood pressures and bring back to your follow up appt. Please call the office with any questions.   Patients taking blood thinners should generally stay away from medicines like ibuprofen, Advil, Motrin, naproxen, and Aleve due to risk of stomach bleeding. You may take Tylenol as directed or talk to your primary doctor about alternatives.  PLEASE ENSURE THAT YOU DO NOT RUN OUT OF YOUR BRILINTA. This medication is very important to remain on for at least one year. IF you have issues obtaining this medication due to cost please CALL the  office 3-5 business days prior to running out in order to prevent missing doses of this medication.   Increase activity slowly   Complete by: As directed         Discharge Medications   Allergies as of 07/27/2023       Reactions   Aspirin Other (See Comments)   Pt can only take low dose aspirin   Codeine Other (See Comments)   Headache   Sulfa Antibiotics Nausea And Vomiting   Tetracyclines & Related Nausea And Vomiting, Other (See Comments)   Headache   Tizanidine Nausea And Vomiting   Penicillins Hives   Tolerates ceftriaxone  Has patient had a PCN reaction causing immediate rash, facial/tongue/throat swelling, SOB or lightheadedness with hypotension: Yes Has patient had a PCN reaction causing severe rash involving mucus membranes or skin necrosis: No Has patient had a PCN reaction that required hospitalization: No Has patient had a PCN reaction occurring within the last 10 years: No If all of the above answers are "NO", then may proceed with Cephalosporin use.        Medication List     STOP taking these medications    amLODipine 2.5 MG tablet Commonly known as: NORVASC   ibuprofen 800 MG tablet Commonly known as: ADVIL   predniSONE 10 MG (21) Tbpk tablet Commonly known as: STERAPRED UNI-PAK 21 TAB   rosuvastatin 5 MG tablet Commonly known as: CRESTOR       TAKE these medications    ADULT GUMMY PO Take 2 tablets by mouth daily.   albuterol 108 (90 Base) MCG/ACT inhaler Commonly known as: VENTOLIN HFA Inhale 2 puffs into the lungs every 6 (six) hours as needed for wheezing or shortness of breath.   aspirin 81 MG chewable tablet Chew 81 mg by mouth daily.   atorvastatin 80 MG tablet Commonly known as: LIPITOR Take 1 tablet (80 mg total) by mouth daily. Start taking on: July 28, 2023   CALCIUM GUMMIES PO Take 2 tablets by mouth daily.   cefUROXime 500 MG tablet Commonly known as: CEFTIN Take 1 tablet (500 mg total) by mouth 2 (two) times  daily with a meal for 3 days.   FISH OIL ADULT GUMMIES PO Take 2 tablets by mouth daily.   losartan 25 MG tablet Commonly known as: COZAAR Take 1 tablet (25 mg total) by mouth daily. Start taking on: July 28, 2023   metoprolol succinate 25 MG 24 hr tablet Commonly known as: TOPROL-XL Take 1 tablet (25 mg total) by mouth daily. Start taking on: July 28, 2023   nitroGLYCERIN 0.4 MG SL tablet Commonly known as: NITROSTAT Place 1  tablet (0.4 mg total) under the tongue every 5 (five) minutes as needed.   ondansetron 8 MG disintegrating tablet Commonly known as: Zofran ODT Take 1 tablet (8 mg total) by mouth every 8 (eight) hours as needed for nausea or vomiting.   tamsulosin 0.4 MG Caps capsule Commonly known as: FLOMAX Take 1 capsule (0.4 mg total) by mouth daily.   ticagrelor 90 MG Tabs tablet Commonly known as: BRILINTA Take 1 tablet (90 mg total) by mouth 2 (two) times daily.   TRIAMCINOLONE ACETONIDE EX Apply 1 application topically daily as needed (rash).   VITAMIN D3 ADULT GUMMIES PO Take 2 tablets by mouth daily.               Durable Medical Equipment  (From admission, onward)           Start     Ordered   07/27/23 1259  For home use only DME Bedside commode  Once       Comments: Pt's Generalized weakness and decreased activity tolerance necessitate recommendation for bedside commode.  Question:  Patient needs a bedside commode to treat with the following condition  Answer:  Weakness   07/27/23 1259   07/27/23 1258  For home use only DME Walker rolling  Once       Question Answer Comment  Walker: With 5 Inch Wheels   Patient needs a walker to treat with the following condition Gait instability      07/27/23 1259               Outstanding Labs/Studies   FLP/LFTs in 8 weeks   Duration of Discharge Encounter   Greater than 30 minutes including physician time.  Signed, Laverda Page, NP 07/27/2023, 1:21 PM

## 2023-07-27 NOTE — Progress Notes (Signed)
Occupational Therapy Treatment Patient Details Name: Alyssa James MRN: [id number] DOB: 10-Feb-1951 Today's Date: 07/27/2023   History of present illness Pt is a 72 yo female admitted 12/6 for STEMI, 12/6 LHC with angiography, 12/9 Coronary stent via radial access. PMH: Anxiety, GERD, HLD, HTN, Raynaud disease   OT comments  Pt donned socks long sitting in bed with min assist. Supervised to EOB for lines. Pt stood with CGA and ambulated with RW in room and to sink with CGA. Stood for grooming at sink. Remained up in chair at end of session. Pt reports her grandchildren can take turns staying with her at home. Updated d/c recommendation to HHOT.      If plan is discharge home, recommend the following:  A little help with walking and/or transfers;A little help with bathing/dressing/bathroom;Assistance with cooking/housework;Assist for transportation;Help with stairs or ramp for entrance   Equipment Recommendations  BSC/3in1;Other (comment) (RW)    Recommendations for Other Services      Precautions / Restrictions Precautions Precautions: Fall Restrictions Weight Bearing Restrictions: No       Mobility Bed Mobility Overal bed mobility: Needs Assistance Bed Mobility: Supine to Sit     Supine to sit: Supervision     General bed mobility comments: supervision for lines    Transfers Overall transfer level: Needs assistance Equipment used: Rolling walker (2 wheels) Transfers: Sit to/from Stand Sit to Stand: Contact guard assist           General transfer comment: cues to push up from sitting surface     Balance Overall balance assessment: Needs assistance Sitting-balance support: No upper extremity supported, Feet supported Sitting balance-Leahy Scale: Good     Standing balance support: Bilateral upper extremity supported, During functional activity Standing balance-Leahy Scale: Poor Standing balance comment: reliant on RW                            ADL either performed or assessed with clinical judgement   ADL Overall ADL's : Needs assistance/impaired     Grooming: Wash/dry hands;Standing;Contact guard assist;Brushing hair               Lower Body Dressing: Minimal assistance;Sitting/lateral leans   Toilet Transfer: Contact guard assist;Ambulation;BSC/3in1;Rolling walker (2 wheels)   Toileting- Clothing Manipulation and Hygiene: Minimal assistance;Sit to/from stand       Functional mobility during ADLs: Contact guard assist;Rolling walker (2 wheels)      Extremity/Trunk Assessment              Vision       Perception     Praxis      Cognition Arousal: Alert Behavior During Therapy: Flat affect Overall Cognitive Status: No family/caregiver present to determine baseline cognitive functioning                                 General Comments: slow processing, needing repeated cues for hand placement with sit to stand        Exercises      Shoulder Instructions       General Comments      Pertinent Vitals/ Pain       Pain Assessment Pain Assessment: No/denies pain  Home Living  Prior Functioning/Environment              Frequency  Min 1X/week        Progress Toward Goals  OT Goals(current goals can now be found in the care plan section)  Progress towards OT goals: Progressing toward goals  Acute Rehab OT Goals OT Goal Formulation: With patient Time For Goal Achievement: 08/09/23 Potential to Achieve Goals: Good  Plan      Co-evaluation                 AM-PAC OT "6 Clicks" Daily Activity     Outcome Measure   Help from another person eating meals?: None Help from another person taking care of personal grooming?: A Little Help from another person toileting, which includes using toliet, bedpan, or urinal?: A Little Help from another person bathing (including washing, rinsing, drying)?: A  Little Help from another person to put on and taking off regular upper body clothing?: A Little Help from another person to put on and taking off regular lower body clothing?: A Little 6 Click Score: 19    End of Session Equipment Utilized During Treatment: Gait belt;Rolling walker (2 wheels)  OT Visit Diagnosis: Muscle weakness (generalized) (M62.81);Unsteadiness on feet (R26.81)   Activity Tolerance Patient tolerated treatment well   Patient Left in chair;with call bell/phone within reach;with chair alarm set   Nurse Communication          Time: 9562-1308 OT Time Calculation (min): 23 min  Charges: OT General Charges $OT Visit: 1 Visit OT Treatments $Self Care/Home Management : 23-37 mins  Berna Spare, OTR/L Acute Rehabilitation Services Office: [phone]   Evern Bio 07/27/2023, 11:54 AM

## 2023-07-27 NOTE — Progress Notes (Cosign Needed)
    Durable Medical Equipment  (From admission, onward)           Start     Ordered   07/27/23 1259  For home use only DME Bedside commode  Once       Comments: Pt's Generalized weakness and decreased activity tolerance necessitate recommendation for bedside commode.  Question:  Patient needs a bedside commode to treat with the following condition  Answer:  Weakness   07/27/23 1259   07/27/23 1258  For home use only DME Walker rolling  Once       Question Answer Comment  Walker: With 5 Inch Wheels   Patient needs a walker to treat with the following condition Gait instability      07/27/23 1259

## 2023-07-27 NOTE — Plan of Care (Signed)

## 2023-07-27 NOTE — Progress Notes (Addendum)
   Patient Name: Alyssa James Date of Encounter: 07/27/2023 [facility] Cardiologist: Yates Decamp, MD   Interval Summary  .    No further chest pain. Still with persistent cough.   Vital Signs .    Vitals:   07/27/23 0432 07/27/23 0700 07/27/23 0800 07/27/23 0818  BP:  (!) 112/53 95/70   Pulse:  67 86   Resp:  16 18   Temp: 98 F (36.7 C)   98.1 F (36.7 C)  TempSrc: Oral   Oral  SpO2:  98% 100%   Weight:      Height:        Intake/Output Summary (Last 24 hours) at 07/27/2023 0935 Last data filed at 07/26/2023 1800 Gross per 24 hour  Intake 340 ml  Output 651 ml  Net -311 ml      07/26/2023   10:49 PM 07/24/2023    8:00 PM 07/22/2023    3:58 PM  Last 3 Weights  Weight (lbs) 119 lb 4.3 oz 125 lb 14.1 oz 125 lb  Weight (kg) 54.1 kg 57.1 kg 56.7 kg      Telemetry/ECG    Sinus Rhythm - Personally Reviewed  Physical Exam .   GEN: No acute distress.   Neck: No JVD Cardiac: RRR, no murmurs, rubs, or gallops.  Respiratory: Clear to auscultation bilaterally. GI: Soft, nontender, non-distended  MS: No edema  Assessment & Plan .     Anterior STEMI -- Underwent cath 12/6 with successful PCI to occluded LAD with overlapping DES x 2.  Troponin greater than 24,000.  EKG showed resolution of ST elevation.  Did have persistent T wave inversion.  Underwent staged intervention with successful PCI/DES x 2 of proximal RCA and distal RCA.  -- Recommendations for DAPT with aspirin/Brilinta for at least 1 year. -- Continue aspirin, Brilinta, atorvastatin 80 mg daily, metoprolol XL 25 mg daily, losartan 25 mg daily -- Cardiac Rehab to see  Right upper lobe pneumonia -- Diagnosed as an outpatient, persistent cough.  Did have elevated white count of 19,000 and now trending down. --Transition to Ceftin 500 mg twice daily to be completed on 12/13  Elevated LFTs -- In the setting of acute MI, now improved  Hypertension -- Blood pressure somewhat marginal -- Continue  Toprol XL 25 mg daily, losartan 25 mg daily  HFmrEF ICM  -- LVEF of 45 to 50%, septal, apical, distal anterior wall and inferior hypokinesis, normal RV, trivial MR -- GDMT: As above continue Toprol XL 25 mg daily, losartan 25 mg daily  Hyperlipidemia --LDL 98, HDL 39, LP(a) 91 --Continue atorvastatin 80 mg daily  Deconditioned, working with PT/OT.  Will follow-up for further recommendations pending disposition. She would like to go back home if possible.   For questions or updates, please contact [facility] Please consult [email or web address] for contact info under        Signed, Laverda Page, NP

## 2023-07-27 NOTE — Progress Notes (Signed)
CARDIAC REHAB PHASE I   PRE:  Rate/Rhythm: 82 SR    BP: sitting 125/59    SpO2:   MODE:  Ambulation: 50 ft   POST:  Rate/Rhythm: 98 SR    BP: sitting 133/60     SpO2:   Pt continues with weakness. She slowly moved out of bed and able to power up slowly. Slow pace with RW, gait belt contact guard assist. Pt left leg weaker than right, struggled to bring it forward. She sts this is new for her since admit. Fatigue with distance. Return to bed. Could not pick up her left leg to get in bed. VSS  Discussed with pt MI, stents, restrictions, Brilinta, diet, walking as able with family, NTG and CRPII. Pt receptive. Understands that she should not be up alone at this point. Will refer to [place] [facility].  [id number]  Alyssa James BS, ACSM-CEP 07/27/2023 2:43 PM

## 2023-07-29 DIAGNOSIS — Z48812 Encounter for surgical aftercare following surgery on the circulatory system: Secondary | ICD-10-CM | POA: Diagnosis not present

## 2023-07-29 DIAGNOSIS — F419 Anxiety disorder, unspecified: Secondary | ICD-10-CM | POA: Diagnosis not present

## 2023-07-29 DIAGNOSIS — K219 Gastro-esophageal reflux disease without esophagitis: Secondary | ICD-10-CM | POA: Diagnosis not present

## 2023-07-29 DIAGNOSIS — I2109 ST elevation (STEMI) myocardial infarction involving other coronary artery of anterior wall: Secondary | ICD-10-CM | POA: Diagnosis not present

## 2023-07-29 DIAGNOSIS — Z7902 Long term (current) use of antithrombotics/antiplatelets: Secondary | ICD-10-CM | POA: Diagnosis not present

## 2023-07-29 DIAGNOSIS — Z955 Presence of coronary angioplasty implant and graft: Secondary | ICD-10-CM | POA: Diagnosis not present

## 2023-07-29 DIAGNOSIS — I2510 Atherosclerotic heart disease of native coronary artery without angina pectoris: Secondary | ICD-10-CM | POA: Diagnosis not present

## 2023-07-29 DIAGNOSIS — J188 Other pneumonia, unspecified organism: Secondary | ICD-10-CM | POA: Diagnosis not present

## 2023-07-29 DIAGNOSIS — D649 Anemia, unspecified: Secondary | ICD-10-CM | POA: Diagnosis not present

## 2023-07-29 DIAGNOSIS — I429 Cardiomyopathy, unspecified: Secondary | ICD-10-CM | POA: Diagnosis not present

## 2023-07-29 DIAGNOSIS — I10 Essential (primary) hypertension: Secondary | ICD-10-CM | POA: Diagnosis not present

## 2023-07-29 DIAGNOSIS — Z7982 Long term (current) use of aspirin: Secondary | ICD-10-CM | POA: Diagnosis not present

## 2023-07-29 DIAGNOSIS — I5020 Unspecified systolic (congestive) heart failure: Secondary | ICD-10-CM | POA: Diagnosis not present

## 2023-07-29 DIAGNOSIS — Z87442 Personal history of urinary calculi: Secondary | ICD-10-CM | POA: Diagnosis not present

## 2023-07-29 DIAGNOSIS — I110 Hypertensive heart disease with heart failure: Secondary | ICD-10-CM | POA: Diagnosis not present

## 2023-07-29 DIAGNOSIS — E7800 Pure hypercholesterolemia, unspecified: Secondary | ICD-10-CM | POA: Diagnosis not present

## 2023-08-01 DIAGNOSIS — I2109 ST elevation (STEMI) myocardial infarction involving other coronary artery of anterior wall: Secondary | ICD-10-CM | POA: Diagnosis not present

## 2023-08-01 DIAGNOSIS — I2510 Atherosclerotic heart disease of native coronary artery without angina pectoris: Secondary | ICD-10-CM | POA: Diagnosis not present

## 2023-08-01 DIAGNOSIS — I110 Hypertensive heart disease with heart failure: Secondary | ICD-10-CM | POA: Diagnosis not present

## 2023-08-01 DIAGNOSIS — I5020 Unspecified systolic (congestive) heart failure: Secondary | ICD-10-CM | POA: Diagnosis not present

## 2023-08-01 DIAGNOSIS — I429 Cardiomyopathy, unspecified: Secondary | ICD-10-CM | POA: Diagnosis not present

## 2023-08-01 DIAGNOSIS — Z48812 Encounter for surgical aftercare following surgery on the circulatory system: Secondary | ICD-10-CM | POA: Diagnosis not present

## 2023-08-02 DIAGNOSIS — I5020 Unspecified systolic (congestive) heart failure: Secondary | ICD-10-CM | POA: Diagnosis not present

## 2023-08-02 DIAGNOSIS — I110 Hypertensive heart disease with heart failure: Secondary | ICD-10-CM | POA: Diagnosis not present

## 2023-08-02 DIAGNOSIS — I2109 ST elevation (STEMI) myocardial infarction involving other coronary artery of anterior wall: Secondary | ICD-10-CM | POA: Diagnosis not present

## 2023-08-02 DIAGNOSIS — I429 Cardiomyopathy, unspecified: Secondary | ICD-10-CM | POA: Diagnosis not present

## 2023-08-02 DIAGNOSIS — Z48812 Encounter for surgical aftercare following surgery on the circulatory system: Secondary | ICD-10-CM | POA: Diagnosis not present

## 2023-08-02 DIAGNOSIS — I2510 Atherosclerotic heart disease of native coronary artery without angina pectoris: Secondary | ICD-10-CM | POA: Diagnosis not present

## 2023-08-04 ENCOUNTER — Telehealth (HOSPITAL_COMMUNITY): Payer: Self-pay

## 2023-08-04 DIAGNOSIS — I110 Hypertensive heart disease with heart failure: Secondary | ICD-10-CM | POA: Diagnosis not present

## 2023-08-04 DIAGNOSIS — I2510 Atherosclerotic heart disease of native coronary artery without angina pectoris: Secondary | ICD-10-CM | POA: Diagnosis not present

## 2023-08-04 DIAGNOSIS — Z48812 Encounter for surgical aftercare following surgery on the circulatory system: Secondary | ICD-10-CM | POA: Diagnosis not present

## 2023-08-04 DIAGNOSIS — I2109 ST elevation (STEMI) myocardial infarction involving other coronary artery of anterior wall: Secondary | ICD-10-CM | POA: Diagnosis not present

## 2023-08-04 DIAGNOSIS — I5020 Unspecified systolic (congestive) heart failure: Secondary | ICD-10-CM | POA: Diagnosis not present

## 2023-08-04 DIAGNOSIS — I429 Cardiomyopathy, unspecified: Secondary | ICD-10-CM | POA: Diagnosis not present

## 2023-08-04 NOTE — Telephone Encounter (Signed)
Cardiac rehab referral faxed to [facility].

## 2023-08-05 DIAGNOSIS — Z48812 Encounter for surgical aftercare following surgery on the circulatory system: Secondary | ICD-10-CM | POA: Diagnosis not present

## 2023-08-05 DIAGNOSIS — I5020 Unspecified systolic (congestive) heart failure: Secondary | ICD-10-CM | POA: Diagnosis not present

## 2023-08-05 DIAGNOSIS — I2109 ST elevation (STEMI) myocardial infarction involving other coronary artery of anterior wall: Secondary | ICD-10-CM | POA: Diagnosis not present

## 2023-08-05 DIAGNOSIS — I429 Cardiomyopathy, unspecified: Secondary | ICD-10-CM | POA: Diagnosis not present

## 2023-08-05 DIAGNOSIS — I2510 Atherosclerotic heart disease of native coronary artery without angina pectoris: Secondary | ICD-10-CM | POA: Diagnosis not present

## 2023-08-05 DIAGNOSIS — I110 Hypertensive heart disease with heart failure: Secondary | ICD-10-CM | POA: Diagnosis not present

## 2023-08-08 DIAGNOSIS — I429 Cardiomyopathy, unspecified: Secondary | ICD-10-CM | POA: Diagnosis not present

## 2023-08-08 DIAGNOSIS — I2109 ST elevation (STEMI) myocardial infarction involving other coronary artery of anterior wall: Secondary | ICD-10-CM | POA: Diagnosis not present

## 2023-08-08 DIAGNOSIS — I5020 Unspecified systolic (congestive) heart failure: Secondary | ICD-10-CM | POA: Diagnosis not present

## 2023-08-08 DIAGNOSIS — Z48812 Encounter for surgical aftercare following surgery on the circulatory system: Secondary | ICD-10-CM | POA: Diagnosis not present

## 2023-08-08 DIAGNOSIS — I110 Hypertensive heart disease with heart failure: Secondary | ICD-10-CM | POA: Diagnosis not present

## 2023-08-08 DIAGNOSIS — I2510 Atherosclerotic heart disease of native coronary artery without angina pectoris: Secondary | ICD-10-CM | POA: Diagnosis not present

## 2023-08-11 DIAGNOSIS — I110 Hypertensive heart disease with heart failure: Secondary | ICD-10-CM | POA: Diagnosis not present

## 2023-08-11 DIAGNOSIS — I2510 Atherosclerotic heart disease of native coronary artery without angina pectoris: Secondary | ICD-10-CM | POA: Diagnosis not present

## 2023-08-11 DIAGNOSIS — I429 Cardiomyopathy, unspecified: Secondary | ICD-10-CM | POA: Diagnosis not present

## 2023-08-11 DIAGNOSIS — I2109 ST elevation (STEMI) myocardial infarction involving other coronary artery of anterior wall: Secondary | ICD-10-CM | POA: Diagnosis not present

## 2023-08-11 DIAGNOSIS — Z48812 Encounter for surgical aftercare following surgery on the circulatory system: Secondary | ICD-10-CM | POA: Diagnosis not present

## 2023-08-11 DIAGNOSIS — I5020 Unspecified systolic (congestive) heart failure: Secondary | ICD-10-CM | POA: Diagnosis not present

## 2023-08-13 DIAGNOSIS — I2109 ST elevation (STEMI) myocardial infarction involving other coronary artery of anterior wall: Secondary | ICD-10-CM | POA: Diagnosis not present

## 2023-08-13 DIAGNOSIS — Z48812 Encounter for surgical aftercare following surgery on the circulatory system: Secondary | ICD-10-CM | POA: Diagnosis not present

## 2023-08-13 DIAGNOSIS — I429 Cardiomyopathy, unspecified: Secondary | ICD-10-CM | POA: Diagnosis not present

## 2023-08-13 DIAGNOSIS — I5020 Unspecified systolic (congestive) heart failure: Secondary | ICD-10-CM | POA: Diagnosis not present

## 2023-08-13 DIAGNOSIS — I2510 Atherosclerotic heart disease of native coronary artery without angina pectoris: Secondary | ICD-10-CM | POA: Diagnosis not present

## 2023-08-13 DIAGNOSIS — I110 Hypertensive heart disease with heart failure: Secondary | ICD-10-CM | POA: Diagnosis not present

## 2023-08-15 DIAGNOSIS — I429 Cardiomyopathy, unspecified: Secondary | ICD-10-CM | POA: Diagnosis not present

## 2023-08-15 DIAGNOSIS — I110 Hypertensive heart disease with heart failure: Secondary | ICD-10-CM | POA: Diagnosis not present

## 2023-08-15 DIAGNOSIS — Z48812 Encounter for surgical aftercare following surgery on the circulatory system: Secondary | ICD-10-CM | POA: Diagnosis not present

## 2023-08-15 DIAGNOSIS — I5020 Unspecified systolic (congestive) heart failure: Secondary | ICD-10-CM | POA: Diagnosis not present

## 2023-08-15 DIAGNOSIS — I2109 ST elevation (STEMI) myocardial infarction involving other coronary artery of anterior wall: Secondary | ICD-10-CM | POA: Diagnosis not present

## 2023-08-15 DIAGNOSIS — I2510 Atherosclerotic heart disease of native coronary artery without angina pectoris: Secondary | ICD-10-CM | POA: Diagnosis not present

## 2023-08-16 DIAGNOSIS — I2510 Atherosclerotic heart disease of native coronary artery without angina pectoris: Secondary | ICD-10-CM | POA: Diagnosis not present

## 2023-08-16 DIAGNOSIS — Z48812 Encounter for surgical aftercare following surgery on the circulatory system: Secondary | ICD-10-CM | POA: Diagnosis not present

## 2023-08-16 DIAGNOSIS — I5020 Unspecified systolic (congestive) heart failure: Secondary | ICD-10-CM | POA: Diagnosis not present

## 2023-08-16 DIAGNOSIS — I110 Hypertensive heart disease with heart failure: Secondary | ICD-10-CM | POA: Diagnosis not present

## 2023-08-16 DIAGNOSIS — I429 Cardiomyopathy, unspecified: Secondary | ICD-10-CM | POA: Diagnosis not present

## 2023-08-16 DIAGNOSIS — I2109 ST elevation (STEMI) myocardial infarction involving other coronary artery of anterior wall: Secondary | ICD-10-CM | POA: Diagnosis not present

## 2023-08-18 DIAGNOSIS — I2510 Atherosclerotic heart disease of native coronary artery without angina pectoris: Secondary | ICD-10-CM | POA: Diagnosis not present

## 2023-08-18 DIAGNOSIS — I2109 ST elevation (STEMI) myocardial infarction involving other coronary artery of anterior wall: Secondary | ICD-10-CM | POA: Diagnosis not present

## 2023-08-18 DIAGNOSIS — I5020 Unspecified systolic (congestive) heart failure: Secondary | ICD-10-CM | POA: Diagnosis not present

## 2023-08-18 DIAGNOSIS — I110 Hypertensive heart disease with heart failure: Secondary | ICD-10-CM | POA: Diagnosis not present

## 2023-08-18 DIAGNOSIS — Z48812 Encounter for surgical aftercare following surgery on the circulatory system: Secondary | ICD-10-CM | POA: Diagnosis not present

## 2023-08-18 DIAGNOSIS — I429 Cardiomyopathy, unspecified: Secondary | ICD-10-CM | POA: Diagnosis not present

## 2023-08-23 ENCOUNTER — Other Ambulatory Visit (HOSPITAL_COMMUNITY): Payer: Self-pay

## 2023-08-23 DIAGNOSIS — Z48812 Encounter for surgical aftercare following surgery on the circulatory system: Secondary | ICD-10-CM | POA: Diagnosis not present

## 2023-08-23 DIAGNOSIS — I110 Hypertensive heart disease with heart failure: Secondary | ICD-10-CM | POA: Diagnosis not present

## 2023-08-23 DIAGNOSIS — I2109 ST elevation (STEMI) myocardial infarction involving other coronary artery of anterior wall: Secondary | ICD-10-CM | POA: Diagnosis not present

## 2023-08-23 DIAGNOSIS — I429 Cardiomyopathy, unspecified: Secondary | ICD-10-CM | POA: Diagnosis not present

## 2023-08-23 DIAGNOSIS — I2510 Atherosclerotic heart disease of native coronary artery without angina pectoris: Secondary | ICD-10-CM | POA: Diagnosis not present

## 2023-08-23 DIAGNOSIS — I5020 Unspecified systolic (congestive) heart failure: Secondary | ICD-10-CM | POA: Diagnosis not present

## 2023-08-24 ENCOUNTER — Institutional Professional Consult (permissible substitution) (HOSPITAL_BASED_OUTPATIENT_CLINIC_OR_DEPARTMENT_OTHER): Payer: Medicare Other | Admitting: Nurse Practitioner

## 2023-08-24 DIAGNOSIS — I429 Cardiomyopathy, unspecified: Secondary | ICD-10-CM | POA: Diagnosis not present

## 2023-08-24 DIAGNOSIS — I2510 Atherosclerotic heart disease of native coronary artery without angina pectoris: Secondary | ICD-10-CM | POA: Diagnosis not present

## 2023-08-24 DIAGNOSIS — Z48812 Encounter for surgical aftercare following surgery on the circulatory system: Secondary | ICD-10-CM | POA: Diagnosis not present

## 2023-08-24 DIAGNOSIS — I5020 Unspecified systolic (congestive) heart failure: Secondary | ICD-10-CM | POA: Diagnosis not present

## 2023-08-24 DIAGNOSIS — I110 Hypertensive heart disease with heart failure: Secondary | ICD-10-CM | POA: Diagnosis not present

## 2023-08-24 DIAGNOSIS — I2109 ST elevation (STEMI) myocardial infarction involving other coronary artery of anterior wall: Secondary | ICD-10-CM | POA: Diagnosis not present

## 2023-08-25 ENCOUNTER — Telehealth: Payer: Self-pay | Admitting: Cardiology

## 2023-08-25 MED ORDER — CLOPIDOGREL BISULFATE 75 MG PO TABS: 75.0000 mg | ORAL_TABLET | Freq: Every day | ORAL | 1 refills | Status: DC

## 2023-08-25 NOTE — Telephone Encounter (Signed)
 Received the go ahead to switch patient from brilinta to plavix by secure chat with Dr. Jacinto Halim. Called patient and she agreed to the switch.

## 2023-08-25 NOTE — Telephone Encounter (Signed)
 Called patient back about message. Patient has one day left of Brilinta  and cannot afford the cost. Patient would like to switch to something less expensive. Will send message to Dr. Ladona to see if he is okay with changing patient to something like Plavix . Will forward to Dr. Ladona.

## 2023-08-25 NOTE — Telephone Encounter (Signed)
 Pt c/o medication issue:  1. Name of Medication:   ticagrelor  (BRILINTA ) 90 MG TABS tablet    2. How are you currently taking this medication (dosage and times per day)? As written   3. Are you having a reaction (difficulty breathing--STAT)? No   4. What is your medication issue? Pt called in stating this rx will be $440. She would like to discuss other options

## 2023-08-28 DIAGNOSIS — I110 Hypertensive heart disease with heart failure: Secondary | ICD-10-CM | POA: Diagnosis not present

## 2023-08-28 DIAGNOSIS — Z87442 Personal history of urinary calculi: Secondary | ICD-10-CM | POA: Diagnosis not present

## 2023-08-28 DIAGNOSIS — Z7982 Long term (current) use of aspirin: Secondary | ICD-10-CM | POA: Diagnosis not present

## 2023-08-28 DIAGNOSIS — I2510 Atherosclerotic heart disease of native coronary artery without angina pectoris: Secondary | ICD-10-CM | POA: Diagnosis not present

## 2023-08-28 DIAGNOSIS — Z48812 Encounter for surgical aftercare following surgery on the circulatory system: Secondary | ICD-10-CM | POA: Diagnosis not present

## 2023-08-28 DIAGNOSIS — Z955 Presence of coronary angioplasty implant and graft: Secondary | ICD-10-CM | POA: Diagnosis not present

## 2023-08-28 DIAGNOSIS — Z7902 Long term (current) use of antithrombotics/antiplatelets: Secondary | ICD-10-CM | POA: Diagnosis not present

## 2023-08-28 DIAGNOSIS — D649 Anemia, unspecified: Secondary | ICD-10-CM | POA: Diagnosis not present

## 2023-08-28 DIAGNOSIS — J188 Other pneumonia, unspecified organism: Secondary | ICD-10-CM | POA: Diagnosis not present

## 2023-08-28 DIAGNOSIS — K219 Gastro-esophageal reflux disease without esophagitis: Secondary | ICD-10-CM | POA: Diagnosis not present

## 2023-08-28 DIAGNOSIS — E7800 Pure hypercholesterolemia, unspecified: Secondary | ICD-10-CM | POA: Diagnosis not present

## 2023-08-28 DIAGNOSIS — F419 Anxiety disorder, unspecified: Secondary | ICD-10-CM | POA: Diagnosis not present

## 2023-08-28 DIAGNOSIS — I2109 ST elevation (STEMI) myocardial infarction involving other coronary artery of anterior wall: Secondary | ICD-10-CM | POA: Diagnosis not present

## 2023-08-28 DIAGNOSIS — I429 Cardiomyopathy, unspecified: Secondary | ICD-10-CM | POA: Diagnosis not present

## 2023-08-28 DIAGNOSIS — I5020 Unspecified systolic (congestive) heart failure: Secondary | ICD-10-CM | POA: Diagnosis not present

## 2023-08-28 DIAGNOSIS — I10 Essential (primary) hypertension: Secondary | ICD-10-CM | POA: Diagnosis not present

## 2023-08-30 DIAGNOSIS — Z48812 Encounter for surgical aftercare following surgery on the circulatory system: Secondary | ICD-10-CM | POA: Diagnosis not present

## 2023-08-30 DIAGNOSIS — I429 Cardiomyopathy, unspecified: Secondary | ICD-10-CM | POA: Diagnosis not present

## 2023-08-30 DIAGNOSIS — I2109 ST elevation (STEMI) myocardial infarction involving other coronary artery of anterior wall: Secondary | ICD-10-CM | POA: Diagnosis not present

## 2023-08-30 DIAGNOSIS — I2510 Atherosclerotic heart disease of native coronary artery without angina pectoris: Secondary | ICD-10-CM | POA: Diagnosis not present

## 2023-08-30 DIAGNOSIS — I110 Hypertensive heart disease with heart failure: Secondary | ICD-10-CM | POA: Diagnosis not present

## 2023-08-30 DIAGNOSIS — I5020 Unspecified systolic (congestive) heart failure: Secondary | ICD-10-CM | POA: Diagnosis not present

## 2023-08-31 DIAGNOSIS — I2109 ST elevation (STEMI) myocardial infarction involving other coronary artery of anterior wall: Secondary | ICD-10-CM | POA: Diagnosis not present

## 2023-08-31 DIAGNOSIS — I110 Hypertensive heart disease with heart failure: Secondary | ICD-10-CM | POA: Diagnosis not present

## 2023-08-31 DIAGNOSIS — I5020 Unspecified systolic (congestive) heart failure: Secondary | ICD-10-CM | POA: Diagnosis not present

## 2023-08-31 DIAGNOSIS — I2510 Atherosclerotic heart disease of native coronary artery without angina pectoris: Secondary | ICD-10-CM | POA: Diagnosis not present

## 2023-08-31 DIAGNOSIS — I429 Cardiomyopathy, unspecified: Secondary | ICD-10-CM | POA: Diagnosis not present

## 2023-08-31 DIAGNOSIS — Z48812 Encounter for surgical aftercare following surgery on the circulatory system: Secondary | ICD-10-CM | POA: Diagnosis not present

## 2023-09-02 DIAGNOSIS — I429 Cardiomyopathy, unspecified: Secondary | ICD-10-CM | POA: Diagnosis not present

## 2023-09-02 DIAGNOSIS — I2510 Atherosclerotic heart disease of native coronary artery without angina pectoris: Secondary | ICD-10-CM | POA: Diagnosis not present

## 2023-09-02 DIAGNOSIS — I2109 ST elevation (STEMI) myocardial infarction involving other coronary artery of anterior wall: Secondary | ICD-10-CM | POA: Diagnosis not present

## 2023-09-02 DIAGNOSIS — I110 Hypertensive heart disease with heart failure: Secondary | ICD-10-CM | POA: Diagnosis not present

## 2023-09-02 DIAGNOSIS — Z48812 Encounter for surgical aftercare following surgery on the circulatory system: Secondary | ICD-10-CM | POA: Diagnosis not present

## 2023-09-02 DIAGNOSIS — I5020 Unspecified systolic (congestive) heart failure: Secondary | ICD-10-CM | POA: Diagnosis not present

## 2023-09-06 DIAGNOSIS — I2109 ST elevation (STEMI) myocardial infarction involving other coronary artery of anterior wall: Secondary | ICD-10-CM | POA: Diagnosis not present

## 2023-09-06 DIAGNOSIS — I110 Hypertensive heart disease with heart failure: Secondary | ICD-10-CM | POA: Diagnosis not present

## 2023-09-06 DIAGNOSIS — I429 Cardiomyopathy, unspecified: Secondary | ICD-10-CM | POA: Diagnosis not present

## 2023-09-06 DIAGNOSIS — Z48812 Encounter for surgical aftercare following surgery on the circulatory system: Secondary | ICD-10-CM | POA: Diagnosis not present

## 2023-09-06 DIAGNOSIS — I5020 Unspecified systolic (congestive) heart failure: Secondary | ICD-10-CM | POA: Diagnosis not present

## 2023-09-06 DIAGNOSIS — I2510 Atherosclerotic heart disease of native coronary artery without angina pectoris: Secondary | ICD-10-CM | POA: Diagnosis not present

## 2023-09-08 DIAGNOSIS — Z48812 Encounter for surgical aftercare following surgery on the circulatory system: Secondary | ICD-10-CM | POA: Diagnosis not present

## 2023-09-08 DIAGNOSIS — I2109 ST elevation (STEMI) myocardial infarction involving other coronary artery of anterior wall: Secondary | ICD-10-CM | POA: Diagnosis not present

## 2023-09-08 DIAGNOSIS — I2510 Atherosclerotic heart disease of native coronary artery without angina pectoris: Secondary | ICD-10-CM | POA: Diagnosis not present

## 2023-09-08 DIAGNOSIS — I429 Cardiomyopathy, unspecified: Secondary | ICD-10-CM | POA: Diagnosis not present

## 2023-09-08 DIAGNOSIS — I110 Hypertensive heart disease with heart failure: Secondary | ICD-10-CM | POA: Diagnosis not present

## 2023-09-08 DIAGNOSIS — I5020 Unspecified systolic (congestive) heart failure: Secondary | ICD-10-CM | POA: Diagnosis not present

## 2023-09-12 DIAGNOSIS — I2510 Atherosclerotic heart disease of native coronary artery without angina pectoris: Secondary | ICD-10-CM | POA: Diagnosis not present

## 2023-09-12 DIAGNOSIS — Z48812 Encounter for surgical aftercare following surgery on the circulatory system: Secondary | ICD-10-CM | POA: Diagnosis not present

## 2023-09-12 DIAGNOSIS — I429 Cardiomyopathy, unspecified: Secondary | ICD-10-CM | POA: Diagnosis not present

## 2023-09-12 DIAGNOSIS — I5020 Unspecified systolic (congestive) heart failure: Secondary | ICD-10-CM | POA: Diagnosis not present

## 2023-09-12 DIAGNOSIS — I110 Hypertensive heart disease with heart failure: Secondary | ICD-10-CM | POA: Diagnosis not present

## 2023-09-12 DIAGNOSIS — I2109 ST elevation (STEMI) myocardial infarction involving other coronary artery of anterior wall: Secondary | ICD-10-CM | POA: Diagnosis not present

## 2023-09-14 DIAGNOSIS — I429 Cardiomyopathy, unspecified: Secondary | ICD-10-CM | POA: Diagnosis not present

## 2023-09-14 DIAGNOSIS — Z48812 Encounter for surgical aftercare following surgery on the circulatory system: Secondary | ICD-10-CM | POA: Diagnosis not present

## 2023-09-14 DIAGNOSIS — I5020 Unspecified systolic (congestive) heart failure: Secondary | ICD-10-CM | POA: Diagnosis not present

## 2023-09-14 DIAGNOSIS — I2510 Atherosclerotic heart disease of native coronary artery without angina pectoris: Secondary | ICD-10-CM | POA: Diagnosis not present

## 2023-09-14 DIAGNOSIS — I2109 ST elevation (STEMI) myocardial infarction involving other coronary artery of anterior wall: Secondary | ICD-10-CM | POA: Diagnosis not present

## 2023-09-14 DIAGNOSIS — I110 Hypertensive heart disease with heart failure: Secondary | ICD-10-CM | POA: Diagnosis not present

## 2023-09-16 DIAGNOSIS — G459 Transient cerebral ischemic attack, unspecified: Secondary | ICD-10-CM | POA: Diagnosis not present

## 2023-09-16 DIAGNOSIS — E782 Mixed hyperlipidemia: Secondary | ICD-10-CM | POA: Diagnosis not present

## 2023-09-16 DIAGNOSIS — I2510 Atherosclerotic heart disease of native coronary artery without angina pectoris: Secondary | ICD-10-CM | POA: Diagnosis not present

## 2023-09-16 DIAGNOSIS — I255 Ischemic cardiomyopathy: Secondary | ICD-10-CM | POA: Diagnosis not present

## 2023-09-16 DIAGNOSIS — M25512 Pain in left shoulder: Secondary | ICD-10-CM | POA: Diagnosis not present

## 2023-09-16 DIAGNOSIS — J189 Pneumonia, unspecified organism: Secondary | ICD-10-CM | POA: Diagnosis not present

## 2023-09-16 DIAGNOSIS — M5116 Intervertebral disc disorders with radiculopathy, lumbar region: Secondary | ICD-10-CM | POA: Diagnosis not present

## 2023-09-16 DIAGNOSIS — Z955 Presence of coronary angioplasty implant and graft: Secondary | ICD-10-CM | POA: Diagnosis not present

## 2023-09-16 DIAGNOSIS — I10 Essential (primary) hypertension: Secondary | ICD-10-CM | POA: Diagnosis not present

## 2023-09-16 DIAGNOSIS — I700 Atherosclerosis of aorta: Secondary | ICD-10-CM | POA: Diagnosis not present

## 2023-09-19 ENCOUNTER — Other Ambulatory Visit: Payer: Self-pay

## 2023-09-19 ENCOUNTER — Encounter (HOSPITAL_BASED_OUTPATIENT_CLINIC_OR_DEPARTMENT_OTHER): Payer: Self-pay | Admitting: Urology

## 2023-09-19 ENCOUNTER — Emergency Department (HOSPITAL_BASED_OUTPATIENT_CLINIC_OR_DEPARTMENT_OTHER): Payer: Medicare Other

## 2023-09-19 DIAGNOSIS — M419 Scoliosis, unspecified: Secondary | ICD-10-CM | POA: Diagnosis not present

## 2023-09-19 DIAGNOSIS — S20212A Contusion of left front wall of thorax, initial encounter: Secondary | ICD-10-CM | POA: Diagnosis not present

## 2023-09-19 DIAGNOSIS — R0781 Pleurodynia: Secondary | ICD-10-CM | POA: Diagnosis present

## 2023-09-19 DIAGNOSIS — I700 Atherosclerosis of aorta: Secondary | ICD-10-CM | POA: Diagnosis not present

## 2023-09-19 DIAGNOSIS — W01198A Fall on same level from slipping, tripping and stumbling with subsequent striking against other object, initial encounter: Secondary | ICD-10-CM | POA: Diagnosis not present

## 2023-09-19 DIAGNOSIS — I429 Cardiomyopathy, unspecified: Secondary | ICD-10-CM | POA: Diagnosis not present

## 2023-09-19 DIAGNOSIS — Z48812 Encounter for surgical aftercare following surgery on the circulatory system: Secondary | ICD-10-CM | POA: Diagnosis not present

## 2023-09-19 DIAGNOSIS — I5020 Unspecified systolic (congestive) heart failure: Secondary | ICD-10-CM | POA: Diagnosis not present

## 2023-09-19 DIAGNOSIS — Z7982 Long term (current) use of aspirin: Secondary | ICD-10-CM | POA: Insufficient documentation

## 2023-09-19 DIAGNOSIS — I110 Hypertensive heart disease with heart failure: Secondary | ICD-10-CM | POA: Diagnosis not present

## 2023-09-19 DIAGNOSIS — Z79899 Other long term (current) drug therapy: Secondary | ICD-10-CM | POA: Insufficient documentation

## 2023-09-19 DIAGNOSIS — S299XXA Unspecified injury of thorax, initial encounter: Secondary | ICD-10-CM | POA: Diagnosis not present

## 2023-09-19 DIAGNOSIS — I2510 Atherosclerotic heart disease of native coronary artery without angina pectoris: Secondary | ICD-10-CM | POA: Diagnosis not present

## 2023-09-19 DIAGNOSIS — R079 Chest pain, unspecified: Secondary | ICD-10-CM | POA: Diagnosis not present

## 2023-09-19 DIAGNOSIS — I2109 ST elevation (STEMI) myocardial infarction involving other coronary artery of anterior wall: Secondary | ICD-10-CM | POA: Diagnosis not present

## 2023-09-19 NOTE — ED Triage Notes (Signed)
Pt ambulatory to triage with cane Pt states lost her balance and fell backwards onto foot stool, states pain to left upper back/ states took her breath away  Did not hit her head, no LOC   On blood thinners  Had MI on 12/6

## 2023-09-19 NOTE — ED Notes (Signed)
 Patient transported to X-ray

## 2023-09-20 ENCOUNTER — Emergency Department (HOSPITAL_BASED_OUTPATIENT_CLINIC_OR_DEPARTMENT_OTHER)
Admission: EM | Admit: 2023-09-20 | Discharge: 2023-09-20 | Disposition: A | Discharge status: Home / Self Care | Payer: Medicare Other | Attending: Emergency Medicine | Admitting: Emergency Medicine

## 2023-09-20 DIAGNOSIS — S20212A Contusion of left front wall of thorax, initial encounter: Secondary | ICD-10-CM

## 2023-09-20 NOTE — ED Provider Notes (Signed)
 [facility] [facility] Provider Note   CSN: [id number] Arrival date & time: 09/19/23  1849     History  Chief Complaint  Patient presents with   [place]    Alyssa James is a 73 y.o. female.  Patient presents to the emergency department for evaluation of injury from a fall.  Patient reports that she lost her balance and fell backwards, hitting her posterior ribs on a stool.  It knocked the air out of her.  Patient reports pain with movement and breathing since the fall.  Did not hit her head.  No neck pain, midline thoracic or lumbar pain.  No extremity injury.       Home Medications Prior to Admission medications   Medication Sig Start Date End Date Taking? Authorizing Provider  albuterol  (PROVENTIL  HFA;VENTOLIN  HFA) 108 (90 BASE) MCG/ACT inhaler Inhale 2 puffs into the lungs every 6 (six) hours as needed for wheezing or shortness of breath.    [provider]  aspirin  81 MG chewable tablet Chew 81 mg by mouth daily.     [provider]  atorvastatin  (LIPITOR ) 80 MG tablet Take 1 tablet (80 mg total) by mouth daily. 07/28/23   Henry Manuelita NOVAK, NP  Calcium -Phosphorus-Vitamin D (CALCIUM  GUMMIES PO) Take 2 tablets by mouth daily.    [provider]  Cholecalciferol (VITAMIN D3 ADULT GUMMIES PO) Take 2 tablets by mouth daily.     [provider]  clopidogrel  (PLAVIX ) 75 MG tablet Take 1 tablet (75 mg total) by mouth daily. 08/25/23   Ladona Heinz, MD  losartan  (COZAAR ) 25 MG tablet Take 1 tablet (25 mg total) by mouth daily. 07/28/23   Henry Manuelita NOVAK, NP  metoprolol  succinate (TOPROL -XL) 25 MG 24 hr tablet Take 1 tablet (25 mg total) by mouth daily. 07/28/23   Henry Manuelita NOVAK, NP  Multiple Vitamins-Minerals (ADULT GUMMY PO) Take 2 tablets by mouth daily.    [provider]  nitroGLYCERIN  (NITROSTAT ) 0.4 MG SL tablet Place 1 tablet (0.4 mg total) under the tongue every 5 (five) minutes as needed.  07/27/23   Henry Manuelita NOVAK, NP  Omega-3 Fatty Acids (FISH OIL ADULT GUMMIES PO) Take 2 tablets by mouth daily.    [provider]  ondansetron  (ZOFRAN  ODT) 8 MG disintegrating tablet Take 1 tablet (8 mg total) by mouth every 8 (eight) hours as needed for nausea or vomiting. 07/23/17   Dasie Faden, MD  tamsulosin  (FLOMAX ) 0.4 MG CAPS capsule Take 1 capsule (0.4 mg total) by mouth daily. 07/23/17   Dasie Faden, MD  TRIAMCINOLONE ACETONIDE EX Apply 1 application topically daily as needed (rash).    [provider]      Allergies    Aspirin , Codeine, Sulfa antibiotics, Tetracyclines & related, Tizanidine, and Penicillins    Review of Systems   Review of Systems  Physical Exam Updated Vital Signs BP (!) 152/73   Pulse 67   Temp 97.6 F (36.4 C) (Oral)   Resp 16   Ht 5' 1 (1.549 m)   Wt 54.1 kg   SpO2 100%   BMI 22.54 kg/m  Physical Exam Vitals and nursing note reviewed.  Constitutional:      General: She is not in acute distress.    Appearance: She is well-developed.  HENT:     Head: Normocephalic and atraumatic.     Mouth/Throat:     Mouth: Mucous membranes are moist.  Eyes:     General: Vision grossly  intact. Gaze aligned appropriately.     Extraocular Movements: Extraocular movements intact.     Conjunctiva/sclera: Conjunctivae normal.  Cardiovascular:     Rate and Rhythm: Normal rate and regular rhythm.     Pulses: Normal pulses.     Heart sounds: Normal heart sounds, S1 normal and S2 normal. No murmur heard.    No friction rub. No gallop.  Pulmonary:     Effort: Pulmonary effort is normal. No respiratory distress.     Breath sounds: Normal breath sounds.  Abdominal:     General: Bowel sounds are normal.     Palpations: Abdomen is soft.     Tenderness: There is no abdominal tenderness. There is no guarding or rebound.     Hernia: No hernia is present.  Musculoskeletal:        General: No swelling.     Cervical back: Full passive range of  motion without pain, normal range of motion and neck supple. No spinous process tenderness or muscular tenderness. Normal range of motion.       Back:     Right lower leg: No edema.     Left lower leg: No edema.  Skin:    General: Skin is warm and dry.     Capillary Refill: Capillary refill takes less than 2 seconds.     Findings: No ecchymosis, erythema, rash or wound.  Neurological:     General: No focal deficit present.     Mental Status: She is alert and oriented to person, place, and time.     GCS: GCS eye subscore is 4. GCS verbal subscore is 5. GCS motor subscore is 6.     Cranial Nerves: Cranial nerves 2-12 are intact.     Sensory: Sensation is intact.     Motor: Motor function is intact.     Coordination: Coordination is intact.  Psychiatric:        Attention and Perception: Attention normal.        Mood and Affect: Mood normal.        Speech: Speech normal.        Behavior: Behavior normal.     ED Results / Procedures / Treatments   Labs (all labs ordered are listed, but only abnormal results are displayed) Labs Reviewed - No data to display  EKG None  Radiology DG Thoracic Spine 2 View Result Date: 09/19/2023 CLINICAL DATA:  Loss of balance. Fell backwards. Trauma to the back and chest. EXAM: THORACIC SPINE 2 VIEWS COMPARISON:  None Available. FINDINGS: Minimal scoliotic curvature and kyphosis of the spine. No evidence regional fracture. Skin marker present at about the T12/L1 level. IMPRESSION: No acute or traumatic finding. Minimal scoliotic curvature and kyphosis of the spine. Electronically Signed   By: Oneil Officer M.D.   On: 09/19/2023 20:18   DG Ribs Unilateral W/Chest Left Result Date: 09/19/2023 CLINICAL DATA:  Clemens backwards with left-sided chest pain EXAM: LEFT RIBS AND CHEST - 3+ VIEW COMPARISON:  07/25/2023 FINDINGS: Heart size is normal. Coronary artery stents are in place. There is aortic atherosclerotic calcification. The lungs are clear. No pneumothorax  or hemothorax. Left rib films do not show fracture. Skin marker projects over the lower posterior rib cage. IMPRESSION: No active cardiopulmonary disease. Coronary artery stents. Aortic atherosclerotic calcification. No rib fracture seen. Electronically Signed   By: Oneil Officer M.D.   On: 09/19/2023 20:17    Procedures Procedures    Medications Ordered in ED Medications - No data to display  ED Course/ Medical Decision Making/ A&P                                 Medical Decision Making Amount and/or Complexity of Data Reviewed Radiology: ordered.   Presents with left flank, posterior rib pain after a fall.  Patient without crepitance on exam.  No hematoma.  No CVA tenderness, no anterior abdominal or left upper quadrant tenderness.  She did not hit her head.  X-ray of thoracic spine, left ribs does not show any acute abnormality.  Patient declines narcotic analgesia.  Provided with incentive spirometry, return precautions.        Final Clinical Impression(s) / ED Diagnoses Final diagnoses:  Rib contusion, left, initial encounter    Rx / DC Orders ED Discharge Orders     None         Dewell Monnier, Lonni PARAS, MD 09/20/23 [phone]

## 2023-09-22 DIAGNOSIS — Z48812 Encounter for surgical aftercare following surgery on the circulatory system: Secondary | ICD-10-CM | POA: Diagnosis not present

## 2023-09-22 DIAGNOSIS — I5020 Unspecified systolic (congestive) heart failure: Secondary | ICD-10-CM | POA: Diagnosis not present

## 2023-09-22 DIAGNOSIS — I2510 Atherosclerotic heart disease of native coronary artery without angina pectoris: Secondary | ICD-10-CM | POA: Diagnosis not present

## 2023-09-22 DIAGNOSIS — I2109 ST elevation (STEMI) myocardial infarction involving other coronary artery of anterior wall: Secondary | ICD-10-CM | POA: Diagnosis not present

## 2023-09-22 DIAGNOSIS — I110 Hypertensive heart disease with heart failure: Secondary | ICD-10-CM | POA: Diagnosis not present

## 2023-09-22 DIAGNOSIS — I429 Cardiomyopathy, unspecified: Secondary | ICD-10-CM | POA: Diagnosis not present

## 2023-09-27 DIAGNOSIS — Z955 Presence of coronary angioplasty implant and graft: Secondary | ICD-10-CM | POA: Diagnosis not present

## 2023-09-27 DIAGNOSIS — Z7982 Long term (current) use of aspirin: Secondary | ICD-10-CM | POA: Diagnosis not present

## 2023-09-27 DIAGNOSIS — K219 Gastro-esophageal reflux disease without esophagitis: Secondary | ICD-10-CM | POA: Diagnosis not present

## 2023-09-27 DIAGNOSIS — Z7902 Long term (current) use of antithrombotics/antiplatelets: Secondary | ICD-10-CM | POA: Diagnosis not present

## 2023-09-27 DIAGNOSIS — E7800 Pure hypercholesterolemia, unspecified: Secondary | ICD-10-CM | POA: Diagnosis not present

## 2023-09-27 DIAGNOSIS — I2510 Atherosclerotic heart disease of native coronary artery without angina pectoris: Secondary | ICD-10-CM | POA: Diagnosis not present

## 2023-09-27 DIAGNOSIS — J188 Other pneumonia, unspecified organism: Secondary | ICD-10-CM | POA: Diagnosis not present

## 2023-09-27 DIAGNOSIS — I5020 Unspecified systolic (congestive) heart failure: Secondary | ICD-10-CM | POA: Diagnosis not present

## 2023-09-27 DIAGNOSIS — Z48812 Encounter for surgical aftercare following surgery on the circulatory system: Secondary | ICD-10-CM | POA: Diagnosis not present

## 2023-09-27 DIAGNOSIS — I429 Cardiomyopathy, unspecified: Secondary | ICD-10-CM | POA: Diagnosis not present

## 2023-09-27 DIAGNOSIS — I10 Essential (primary) hypertension: Secondary | ICD-10-CM | POA: Diagnosis not present

## 2023-09-27 DIAGNOSIS — I110 Hypertensive heart disease with heart failure: Secondary | ICD-10-CM | POA: Diagnosis not present

## 2023-09-27 DIAGNOSIS — Z87442 Personal history of urinary calculi: Secondary | ICD-10-CM | POA: Diagnosis not present

## 2023-09-27 DIAGNOSIS — D649 Anemia, unspecified: Secondary | ICD-10-CM | POA: Diagnosis not present

## 2023-09-27 DIAGNOSIS — I2109 ST elevation (STEMI) myocardial infarction involving other coronary artery of anterior wall: Secondary | ICD-10-CM | POA: Diagnosis not present

## 2023-09-27 DIAGNOSIS — F419 Anxiety disorder, unspecified: Secondary | ICD-10-CM | POA: Diagnosis not present

## 2023-10-01 DIAGNOSIS — Z48812 Encounter for surgical aftercare following surgery on the circulatory system: Secondary | ICD-10-CM | POA: Diagnosis not present

## 2023-10-01 DIAGNOSIS — I110 Hypertensive heart disease with heart failure: Secondary | ICD-10-CM | POA: Diagnosis not present

## 2023-10-01 DIAGNOSIS — I2109 ST elevation (STEMI) myocardial infarction involving other coronary artery of anterior wall: Secondary | ICD-10-CM | POA: Diagnosis not present

## 2023-10-01 DIAGNOSIS — I2510 Atherosclerotic heart disease of native coronary artery without angina pectoris: Secondary | ICD-10-CM | POA: Diagnosis not present

## 2023-10-01 DIAGNOSIS — I5020 Unspecified systolic (congestive) heart failure: Secondary | ICD-10-CM | POA: Diagnosis not present

## 2023-10-01 DIAGNOSIS — I429 Cardiomyopathy, unspecified: Secondary | ICD-10-CM | POA: Diagnosis not present

## 2023-10-03 NOTE — Progress Notes (Unsigned)
Cardiology Office Note:  .   Date:  10/04/2023  ID:  Tawanna Sat, DOB [birth year], MRN [id number] PCP: Georgann Housekeeper, MD  [facility] Providers Cardiologist:  Yates Decamp, MD   History of Present Illness: .   Alyssa James is a 73 y.o. Patient with hypertension, hypercholesterolemia, coronary calcium score in the 88 percentile in 2022, presented with acute anteroseptal STEMI on 07/22/2023, underwent stenting of the proximal and mid LAD and in a staged fashion on 07/25/2023 to the right coronary artery and also optimization of the LAD stent now presents for follow-up.  Discussed the use of AI scribe software for clinical note transcription with the patient, who gave verbal consent to proceed.  History of Present Illness   The patient, with a history of coronary artery disease, presented for a follow-up visit three months after stent placement in the LAD. The patient reported multiple hospital visits since the procedure, including a recent fall resulting in rib contusions. The patient described the pain during the heart attack as intense and localized to the left chest area. Since the hospitalization, the patient has not experienced similar pain. The patient has been managing the pain from the rib contusions with Tylenol and occasionally Tramadol. The patient also reported taking five prescribed medications daily after breakfast. The patient has not used nitroglycerin since leaving the hospital.      Labs   Lab Results  Component Value Date   CHOL 154 07/22/2023   HDL 39 (L) 07/22/2023   LDLCALC 98 07/22/2023   TRIG 86 07/22/2023   CHOLHDL 3.9 07/22/2023   Lab Results  Component Value Date   NA 134 (L) 07/27/2023   K 3.9 07/27/2023   CO2 20 (L) 07/27/2023   GLUCOSE 97 07/27/2023   BUN 16 07/27/2023   CREATININE 0.74 07/27/2023   CALCIUM 8.2 (L) 07/27/2023   GFRNONAA >60 07/27/2023      Latest Ref Rng & Units 07/27/2023    4:55 AM 07/26/2023    4:34 AM 07/25/2023    7:32  AM  BMP  Glucose 70 - 99 mg/dL 97  89  914   BUN 8 - 23 mg/dL 16  16  13    Creatinine 0.44 - 1.00 mg/dL 7.82  9.56  2.13   Sodium 135 - 145 mmol/L 134  138  134   Potassium 3.5 - 5.1 mmol/L 3.9  3.4  3.8   Chloride 98 - 111 mmol/L 108  107  103   CO2 22 - 32 mmol/L 20  20  22    Calcium 8.9 - 10.3 mg/dL 8.2  8.4  8.3       Latest Ref Rng & Units 07/27/2023    4:55 AM 07/26/2023    4:34 AM 07/25/2023    7:32 AM  CBC  WBC 4.0 - 10.5 K/uL 11.7  17.4  19.7   Hemoglobin 12.0 - 15.0 g/dL 8.9  9.0  08.6   Hematocrit 36.0 - 46.0 % 27.5  27.5  30.3   Platelets 150 - 400 K/uL 374  386  424    Lab Results  Component Value Date   HGBA1C 5.4 07/22/2023    No results found for: "TSH"   External labs:  K PN PCP labs 09/16/2023:  Total cholesterol 157, triglycerides 164, HDL 49, LDL 80.  Labs 03/14/2023:  TSH normal at 3.50.  Review of Systems  Cardiovascular:  Negative for chest pain, dyspnea on exertion and leg swelling.   Physical Exam:  VS:  BP (!) 168/76 (BP Location: Left Arm, Patient Position: Sitting, Cuff Size: Normal)   Pulse 66   Resp 16   Ht 5\' 1"  (1.549 m)   Wt 118 lb 3.2 oz (53.6 kg)   BMI 22.33 kg/m    Wt Readings from Last 3 Encounters:  10/04/23 118 lb 3.2 oz (53.6 kg)  09/19/23 119 lb 4.3 oz (54.1 kg)  07/26/23 119 lb 4.3 oz (54.1 kg)    Physical Exam Neck:     Vascular: No carotid bruit or JVD.  Cardiovascular:     Rate and Rhythm: Normal rate and regular rhythm.     Pulses: Intact distal pulses.     Heart sounds: Normal heart sounds. No murmur heard.    No gallop.  Pulmonary:     Effort: Pulmonary effort is normal.     Breath sounds: Normal breath sounds.  Abdominal:     General: Bowel sounds are normal.     Palpations: Abdomen is soft.  Musculoskeletal:     Right lower leg: No edema.     Left lower leg: No edema.    Studies Reviewed: Marland Kitchen    Coronary angioplasty 07/25/23:  Relook at the LAD stents, PTCA and balloon angioplasty of  inadequately expanded stent in the mid LAD placed 07/22/2023, PTCA and stenting of the proximal and distal right coronary artery.   LAD was revisualized, there is a focal inadequate stent expansion in the midsegment, successful balloon angioplasty with 2.5 x 8 mm emerge Salcha at 20 atmospheric pressure with complete expansion achieved.   2.5 x 38 mm and a 2.5 x 16 mm Synergy stents placed in the proximal all the way to the mid segment on 07/22/2023 otherwise widely patent.  RCA: Proximal segment focal 95% and a distal 99% stenosis.  Diffuse long segment disease in the proximal segment. Successful PTCA and stenting of the proximal lesion with a 3.5 x 32 mm Synergy XD at 16 atmospheric pressure and postdilated with a 3.5 x 15 mm Gridley balloon at 20 atmospheric pressure.  Distal RCA was stented with a 3.0 x 12 mm Synergy XD DES at 16 atmospheric pressure.  Stenosis reduced from 95% to 0% and 99% to 0% respectively with maintenance of TIMI-3 flow.     ECHOCARDIOGRAM COMPLETE 07/23/2023  1. Septal , apical, distal anterior wall and inferior apical hypokinesis consistent with multivessel CAD. Left ventricular ejection fraction, by estimation, is 45 to 50%. The left ventricle has mildly decreased function. The left ventricle demonstrates regional wall motion abnormalities: Septal , apical, distal anterior wall and inferior apical hypokinesis consistent with multivessel CAD. Marland Kitchen The left ventricular internal cavity size was mildly dilated. Left ventricular diastolic parameters were normal. 2. Right ventricular systolic function is normal. The right ventricular size is normal. 3. The mitral valve is abnormal. Trivial mitral valve regurgitation. No evidence of mitral stenosis. 4. The aortic valve is tricuspid. There is moderate calcification of the aortic valve. There is moderate thickening of the aortic valve. Aortic valve regurgitation is mild. Aortic valve sclerosis is present, with no evidence of aortic valve  stenosis.  EKG:    EKG Interpretation Date/Time:  Tuesday October 04 2023 09:51:14 EST Ventricular Rate:  65 PR Interval:  146 QRS Duration:  70 QT Interval:  418 QTC Calculation: 434 R Axis:   13  Text Interpretation: EKG 10/04/2023: Normal sinus rhythm with rate of 65 bpm, normal axis, T wave inversion in V1 and V2, cannot exclude septal ischemia.  Compared  to 07/26/2023, T wave inversion in V1 and V2 is new and previously minimal ST elevation with biphasic T wave was evident.  May represent evolution of prior infarct. Confirmed by Delrae Rend [phone]) on 10/04/2023 10:18:10 AM    EKG 07/26/2023: Normal sinus rhythm at rate of 81 bpm, normal EKG.  Medications and allergies    Allergies  Allergen Reactions   Aspirin Other (See Comments)    Pt can only take low dose aspirin   Codeine Other (See Comments)    Headache   Sulfa Antibiotics Nausea And Vomiting   Tetracyclines & Related Nausea And Vomiting and Other (See Comments)    Headache   Tizanidine Nausea And Vomiting   Penicillins Hives    Tolerates ceftriaxone  Has patient had a PCN reaction causing immediate rash, facial/tongue/throat swelling, SOB or lightheadedness with hypotension: Yes Has patient had a PCN reaction causing severe rash involving mucus membranes or skin necrosis: No Has patient had a PCN reaction that required hospitalization: No Has patient had a PCN reaction occurring within the last 10 years: No If all of the above answers are "NO", then may proceed with Cephalosporin use.     Current Outpatient Medications:    amLODipine (NORVASC) 5 MG tablet, Take 1 tablet (5 mg total) by mouth every evening., Disp: 90 tablet, Rfl: 3   aspirin 81 MG chewable tablet, Chew 81 mg by mouth daily. , Disp: , Rfl:    atorvastatin (LIPITOR) 80 MG tablet, Take 1 tablet (80 mg total) by mouth daily., Disp: 90 tablet, Rfl: 1   clopidogrel (PLAVIX) 75 MG tablet, Take 1 tablet (75 mg total) by mouth daily., Disp: 90  tablet, Rfl: 1   ezetimibe (ZETIA) 10 MG tablet, Take 1 tablet (10 mg total) by mouth daily., Disp: 90 tablet, Rfl: 0   losartan (COZAAR) 25 MG tablet, Take 1 tablet (25 mg total) by mouth daily., Disp: 90 tablet, Rfl: 1   metoprolol succinate (TOPROL-XL) 25 MG 24 hr tablet, Take 1 tablet (25 mg total) by mouth daily., Disp: 90 tablet, Rfl: 1   Multiple Vitamins-Minerals (ADULT GUMMY PO), Take 2 tablets by mouth daily., Disp: , Rfl:    nitroGLYCERIN (NITROSTAT) 0.4 MG SL tablet, Place 1 tablet (0.4 mg total) under the tongue every 5 (five) minutes as needed., Disp: 25 tablet, Rfl: 2   ondansetron (ZOFRAN ODT) 8 MG disintegrating tablet, Take 1 tablet (8 mg total) by mouth every 8 (eight) hours as needed for nausea or vomiting., Disp: 20 tablet, Rfl: 0   traMADol (ULTRAM) 50 MG tablet, Take 50 mg by mouth daily as needed., Disp: , Rfl:    ASSESSMENT AND PLAN: .      ICD-10-CM   1. Coronary artery disease of native artery of native heart with stable angina pectoris (HCC)  I25.118 EKG 12-Lead    2. Pure hypercholesterolemia  E78.00 ezetimibe (ZETIA) 10 MG tablet    3. Primary hypertension  I10 amLODipine (NORVASC) 5 MG tablet    4. White coat syndrome with diagnosis of hypertension  I10      Assessment and Plan    Coronary Artery Disease (CAD) with recent Myocardial Infarction (MI) The MI on 07/22/2023 was treated with LAD stent placement. There is no recurrent angina, and both EKG and physical exam are normal. Cholesterol levels have improved with the current regimen however upon review of PCP labs from 09/10/2023, LDL is not at goal, adding Zetia is recommended to lower LDL to approximately 70 mg/dL.  The benefits of combining Lipitor and Zetia for optimal cholesterol control and cost savings with a mail-order pharmacy were explained. Continue Lipitor 80 mg daily and add Zetia 10 mg daily. Cholesterol levels should be checked before the next visit. Follow-up is scheduled in six  months.  Hypertension Home blood pressure readings are generally in the 120s-130s/70s range, but in-office systolic is 168 mmHg. Current antihypertensives are in use. Adding amlodipine is discussed for better control. If home readings exceed 140 mmHg systolic, increase the dose to 10 mg. Emphasize home blood pressure monitoring and reporting via MyChart. Start amlodipine 5 mg daily, monitor blood pressure at home, adjust the dose to 10 mg if blood pressure remains elevated, and report readings via MyChart. Follow-up is scheduled in six months.  Rib Contusions There is ongoing soreness from rib contusions sustained on 09/20/2023. Pain is managed with Tylenol and occasional tramadol, with a preference for minimal medication use. Pain is managed with light activities. Continue Tylenol as needed, use tramadol sparingly, and engage in light activities to manage pain.  General Health Maintenance Vitamin D and calcium intake were discussed. There is no current supplementation. Checking vitamin D levels with the PCP is advised. Consider a multivitamin once daily if desired. Check vitamin D levels with the PCP and consider a multivitamin once daily if desired.  Follow-up Follow-up is scheduled in six months, and cholesterol levels should be checked before the next visit.           Signed,  Yates Decamp, MD, [facility] 10/04/2023, 8:03 PM [facility] [address] [place], [place] [address] Phone: [phone]. Fax:  [phone]

## 2023-10-04 ENCOUNTER — Ambulatory Visit: Payer: Medicare Other | Attending: Cardiology | Admitting: Cardiology

## 2023-10-04 ENCOUNTER — Encounter: Payer: Self-pay | Admitting: Cardiology

## 2023-10-04 VITALS — BP 168/76 | HR 66 | Resp 16 | Ht 61.0 in | Wt 118.2 lb

## 2023-10-04 DIAGNOSIS — I25118 Atherosclerotic heart disease of native coronary artery with other forms of angina pectoris: Secondary | ICD-10-CM | POA: Diagnosis not present

## 2023-10-04 DIAGNOSIS — I10 Essential (primary) hypertension: Secondary | ICD-10-CM | POA: Insufficient documentation

## 2023-10-04 DIAGNOSIS — E7800 Pure hypercholesterolemia, unspecified: Secondary | ICD-10-CM | POA: Insufficient documentation

## 2023-10-04 MED ORDER — AMLODIPINE BESYLATE 5 MG PO TABS: 5.0000 mg | ORAL_TABLET | Freq: Every evening | ORAL | 3 refills | Status: AC

## 2023-10-04 MED ORDER — EZETIMIBE 10 MG PO TABS: 10.0000 mg | ORAL_TABLET | Freq: Every day | ORAL | 0 refills | Status: AC

## 2023-10-04 NOTE — Patient Instructions (Signed)
Medication Instructions:  Your physician has recommended you make the following change in your medication:  Start Amlodipine 5 mg by mouth daily Start Zetia 10 mg by mouth daily *If you need a refill on your cardiac medications before your next appointment, please call your pharmacy*   Lab Work: none If you have labs (blood work) drawn today and your tests are completely normal, you will receive your results only by: MyChart Message (if you have MyChart) OR A paper copy in the mail If you have any lab test that is abnormal or we need to change your treatment, we will call you to review the results.   Testing/Procedures: none   Follow-Up: At [facility], you and your health needs are our priority.  As part of our continuing mission to provide you with exceptional heart care, we have created designated Provider Care Teams.  These Care Teams include your primary Cardiologist (physician) and Advanced Practice Providers (APPs -  Physician Assistants and Nurse Practitioners) who all work together to provide you with the care you need, when you need it.  We recommend signing up for the patient portal called "MyChart".  Sign up information is provided on this After Visit Summary.  MyChart is used to connect with patients for Virtual Visits (Telemedicine).  Patients are able to view lab/test results, encounter notes, upcoming appointments, etc.  Non-urgent messages can be sent to your provider as well.   To learn more about what you can do with MyChart, go to [email or web address].    Your next appointment:   6 month(s)  Provider:   Yates Decamp, MD     Other Instructions

## 2023-10-13 DIAGNOSIS — I5020 Unspecified systolic (congestive) heart failure: Secondary | ICD-10-CM | POA: Diagnosis not present

## 2023-10-13 DIAGNOSIS — I110 Hypertensive heart disease with heart failure: Secondary | ICD-10-CM | POA: Diagnosis not present

## 2023-10-13 DIAGNOSIS — Z48812 Encounter for surgical aftercare following surgery on the circulatory system: Secondary | ICD-10-CM | POA: Diagnosis not present

## 2023-10-13 DIAGNOSIS — I2510 Atherosclerotic heart disease of native coronary artery without angina pectoris: Secondary | ICD-10-CM | POA: Diagnosis not present

## 2023-10-13 DIAGNOSIS — I429 Cardiomyopathy, unspecified: Secondary | ICD-10-CM | POA: Diagnosis not present

## 2023-10-13 DIAGNOSIS — I2109 ST elevation (STEMI) myocardial infarction involving other coronary artery of anterior wall: Secondary | ICD-10-CM | POA: Diagnosis not present

## 2023-10-18 DIAGNOSIS — I429 Cardiomyopathy, unspecified: Secondary | ICD-10-CM | POA: Diagnosis not present

## 2023-10-18 DIAGNOSIS — I5020 Unspecified systolic (congestive) heart failure: Secondary | ICD-10-CM | POA: Diagnosis not present

## 2023-10-18 DIAGNOSIS — I2109 ST elevation (STEMI) myocardial infarction involving other coronary artery of anterior wall: Secondary | ICD-10-CM | POA: Diagnosis not present

## 2023-10-18 DIAGNOSIS — Z48812 Encounter for surgical aftercare following surgery on the circulatory system: Secondary | ICD-10-CM | POA: Diagnosis not present

## 2023-10-18 DIAGNOSIS — I110 Hypertensive heart disease with heart failure: Secondary | ICD-10-CM | POA: Diagnosis not present

## 2023-10-18 DIAGNOSIS — I2510 Atherosclerotic heart disease of native coronary artery without angina pectoris: Secondary | ICD-10-CM | POA: Diagnosis not present

## 2023-10-28 DIAGNOSIS — H43813 Vitreous degeneration, bilateral: Secondary | ICD-10-CM | POA: Diagnosis not present

## 2023-10-28 DIAGNOSIS — H524 Presbyopia: Secondary | ICD-10-CM | POA: Diagnosis not present

## 2023-10-28 DIAGNOSIS — H0100A Unspecified blepharitis right eye, upper and lower eyelids: Secondary | ICD-10-CM | POA: Diagnosis not present

## 2023-10-28 DIAGNOSIS — H2513 Age-related nuclear cataract, bilateral: Secondary | ICD-10-CM | POA: Diagnosis not present

## 2023-10-28 DIAGNOSIS — H5203 Hypermetropia, bilateral: Secondary | ICD-10-CM | POA: Diagnosis not present

## 2023-10-28 DIAGNOSIS — H01004 Unspecified blepharitis left upper eyelid: Secondary | ICD-10-CM | POA: Diagnosis not present

## 2023-10-28 DIAGNOSIS — H25013 Cortical age-related cataract, bilateral: Secondary | ICD-10-CM | POA: Diagnosis not present

## 2023-10-28 DIAGNOSIS — H52203 Unspecified astigmatism, bilateral: Secondary | ICD-10-CM | POA: Diagnosis not present

## 2023-11-07 ENCOUNTER — Telehealth: Payer: Self-pay | Admitting: Cardiology

## 2023-11-07 NOTE — Telephone Encounter (Signed)
   Pre-operative Risk Assessment    Patient Name: Alyssa James  DOB: [birth year] MRN: [id number]      Request for Surgical Clearance    Procedure:   cataract left eye  Date of Surgery:  Clearance 11/14/23                                 Surgeon:  Janet Berlin Surgeon's Group or Practice Name:  [facility] Phone number:  [phone] Fax number:  [phone]   Type of Clearance Requested:   - Medical    Type of Anesthesia:   topical iv mac   Additional requests/questions:   none  Signed, April L Harrington   11/07/2023, 1:58 PM

## 2023-11-07 NOTE — Telephone Encounter (Signed)
   Patient Name: Alyssa James  DOB: 06-Oct-1950 MRN: [id number]  Primary Cardiologist: Yates Decamp, MD  Chart reviewed as part of pre-operative protocol coverage. Cataract extractions are recognized in guidelines as low risk surgeries that do not typically require specific preoperative testing or holding of blood thinner therapy. Therefore, given past medical history and time since last visit, based on ACC/AHA guidelines, TIAWANNA LUCHSINGER would be at acceptable risk for the planned procedure without further cardiovascular testing.   I will route this recommendation to the requesting party via Epic fax function and remove from pre-op pool.  Please call with questions.  Levi Aland, NP-C  11/07/2023, 3:19 PM [address] Office [phone] Fax [phone]

## 2023-11-14 DIAGNOSIS — H2512 Age-related nuclear cataract, left eye: Secondary | ICD-10-CM | POA: Diagnosis not present

## 2023-11-14 DIAGNOSIS — H52222 Regular astigmatism, left eye: Secondary | ICD-10-CM | POA: Diagnosis not present

## 2023-11-14 DIAGNOSIS — H25812 Combined forms of age-related cataract, left eye: Secondary | ICD-10-CM | POA: Diagnosis not present

## 2023-11-14 DIAGNOSIS — Z961 Presence of intraocular lens: Secondary | ICD-10-CM | POA: Diagnosis not present

## 2023-11-14 DIAGNOSIS — H25012 Cortical age-related cataract, left eye: Secondary | ICD-10-CM | POA: Diagnosis not present

## 2023-12-05 DIAGNOSIS — H25811 Combined forms of age-related cataract, right eye: Secondary | ICD-10-CM | POA: Diagnosis not present

## 2023-12-05 DIAGNOSIS — H52221 Regular astigmatism, right eye: Secondary | ICD-10-CM | POA: Diagnosis not present

## 2023-12-05 DIAGNOSIS — H25011 Cortical age-related cataract, right eye: Secondary | ICD-10-CM | POA: Diagnosis not present

## 2023-12-05 DIAGNOSIS — Z961 Presence of intraocular lens: Secondary | ICD-10-CM | POA: Diagnosis not present

## 2023-12-05 DIAGNOSIS — H2511 Age-related nuclear cataract, right eye: Secondary | ICD-10-CM | POA: Diagnosis not present

## 2023-12-30 ENCOUNTER — Ambulatory Visit (INDEPENDENT_AMBULATORY_CARE_PROVIDER_SITE_OTHER): Payer: Medicare Other | Admitting: Otolaryngology

## 2023-12-30 ENCOUNTER — Encounter (INDEPENDENT_AMBULATORY_CARE_PROVIDER_SITE_OTHER): Payer: Self-pay | Admitting: Otolaryngology

## 2023-12-30 VITALS — BP 149/74 | HR 60 | Ht 61.0 in | Wt 119.0 lb

## 2023-12-30 DIAGNOSIS — H903 Sensorineural hearing loss, bilateral: Secondary | ICD-10-CM | POA: Diagnosis not present

## 2023-12-30 DIAGNOSIS — H6123 Impacted cerumen, bilateral: Secondary | ICD-10-CM

## 2023-12-30 DIAGNOSIS — H608X3 Other otitis externa, bilateral: Secondary | ICD-10-CM

## 2024-01-01 DIAGNOSIS — H6123 Impacted cerumen, bilateral: Secondary | ICD-10-CM | POA: Insufficient documentation

## 2024-01-01 DIAGNOSIS — H903 Sensorineural hearing loss, bilateral: Secondary | ICD-10-CM | POA: Insufficient documentation

## 2024-01-01 DIAGNOSIS — H608X3 Other otitis externa, bilateral: Secondary | ICD-10-CM | POA: Insufficient documentation

## 2024-01-01 NOTE — Progress Notes (Signed)
 Patient ID: Alyssa James, female   DOB: 1951/03/05, 73 y.o.   MRN: [id number]  Follow-up: Bilateral hearing loss, chronic eczematous otitis externa  HPI: The patient is a 73 year old female who returns today for her follow-up evaluation.  The patient was previously seen for chronic eczematous otitis externa, bilateral hearing loss, and recurrent cerumen impaction.  She was treated with Elocon cream.  The patient returns today complaining of occasional itchy sensation.  The medication has helped.  She denies any recent change in her hearing.  The patient was diagnosed with an acute myocardial infarction in December 2024.  She was treated with cardiac stent placement.  Currently she denies any otalgia, otorrhea, or vertigo.  Exam: General: Communicates without difficulty, well nourished, no acute distress. Head: Normocephalic, no evidence injury, no tenderness, facial buttresses intact without stepoff. Face/sinus: No tenderness to palpation and percussion. Facial movement is normal and symmetric. Eyes: PERRL, EOMI. No scleral icterus, conjunctivae clear. Neuro: CN II exam reveals vision grossly intact.  No nystagmus at any point of gaze. Ears: Auricles well formed without lesions.  Bilateral cerumen impaction.  Nose: External evaluation reveals normal support and skin without lesions.  Dorsum is intact.  Anterior rhinoscopy reveals congested mucosa over anterior aspect of inferior turbinates and intact septum.  No purulence noted. Oral:  Oral cavity and oropharynx are intact, symmetric, without erythema or edema.  Mucosa is moist without lesions. Neck: Full range of motion without pain.  There is no significant lymphadenopathy.  No masses palpable.  Thyroid  bed within normal limits to palpation.  Parotid glands and submandibular glands equal bilaterally without mass.  Trachea is midline. Neuro:  CN 2-12 grossly intact.   Procedure: Bilateral cerumen disimpaction Anesthesia: None Description: Under the  operating microscope, the cerumen is carefully removed with a combination of cerumen currette, alligator forceps, and suction catheters.  After the cerumen is removed, the TMs are noted to be normal.  Eczematous changes are noted within the ear canals.  No mass, erythema, or lesions. The patient tolerated the procedure well.   Assessment: 1.  Bilateral recurrent cerumen impaction.  After the disimpaction procedure, both tympanic membranes and middle ear spaces are noted to be normal. 2.  Bilateral chronic eczematous otitis externa. 3.  Subjectively stable bilateral sensorineural hearing loss.  Plan: 1.  Otomicroscopy with bilateral cerumen disimpaction. 2.  The physical exam findings are reviewed with the patient. 3.  Continue the use of Elocon cream as needed to treat the chronic eczematous otitis externa. 4.  The patient will return for reevaluation in 1 year.

## 2024-01-10 DIAGNOSIS — M47814 Spondylosis without myelopathy or radiculopathy, thoracic region: Secondary | ICD-10-CM | POA: Diagnosis not present

## 2024-01-10 DIAGNOSIS — M546 Pain in thoracic spine: Secondary | ICD-10-CM | POA: Diagnosis not present

## 2024-01-10 DIAGNOSIS — M47812 Spondylosis without myelopathy or radiculopathy, cervical region: Secondary | ICD-10-CM | POA: Diagnosis not present

## 2024-01-10 DIAGNOSIS — M4184 Other forms of scoliosis, thoracic region: Secondary | ICD-10-CM | POA: Diagnosis not present

## 2024-01-10 DIAGNOSIS — M549 Dorsalgia, unspecified: Secondary | ICD-10-CM | POA: Diagnosis not present

## 2024-01-10 DIAGNOSIS — R0781 Pleurodynia: Secondary | ICD-10-CM | POA: Diagnosis not present

## 2024-01-18 ENCOUNTER — Other Ambulatory Visit: Payer: Self-pay

## 2024-01-18 MED ORDER — LOSARTAN POTASSIUM 25 MG PO TABS: 25.0000 mg | ORAL_TABLET | Freq: Every day | ORAL | 2 refills | Status: AC

## 2024-01-18 MED ORDER — ATORVASTATIN CALCIUM 80 MG PO TABS: 80.0000 mg | ORAL_TABLET | Freq: Every day | ORAL | 2 refills | Status: AC

## 2024-01-18 MED ORDER — METOPROLOL SUCCINATE ER 25 MG PO TB24: 25.0000 mg | ORAL_TABLET | Freq: Every day | ORAL | 2 refills | Status: AC

## 2024-02-10 ENCOUNTER — Other Ambulatory Visit: Payer: Self-pay | Admitting: Cardiology

## 2024-03-14 DIAGNOSIS — I10 Essential (primary) hypertension: Secondary | ICD-10-CM | POA: Diagnosis not present

## 2024-03-14 DIAGNOSIS — G459 Transient cerebral ischemic attack, unspecified: Secondary | ICD-10-CM | POA: Diagnosis not present

## 2024-03-14 DIAGNOSIS — J45909 Unspecified asthma, uncomplicated: Secondary | ICD-10-CM | POA: Diagnosis not present

## 2024-03-14 DIAGNOSIS — Z0000 Encounter for general adult medical examination without abnormal findings: Secondary | ICD-10-CM | POA: Diagnosis not present

## 2024-03-14 DIAGNOSIS — G43909 Migraine, unspecified, not intractable, without status migrainosus: Secondary | ICD-10-CM | POA: Diagnosis not present

## 2024-03-14 DIAGNOSIS — E782 Mixed hyperlipidemia: Secondary | ICD-10-CM | POA: Diagnosis not present

## 2024-03-14 DIAGNOSIS — Z1331 Encounter for screening for depression: Secondary | ICD-10-CM | POA: Diagnosis not present

## 2024-03-14 DIAGNOSIS — E559 Vitamin D deficiency, unspecified: Secondary | ICD-10-CM | POA: Diagnosis not present

## 2024-03-14 DIAGNOSIS — M810 Age-related osteoporosis without current pathological fracture: Secondary | ICD-10-CM | POA: Diagnosis not present

## 2024-03-14 DIAGNOSIS — I700 Atherosclerosis of aorta: Secondary | ICD-10-CM | POA: Diagnosis not present

## 2024-03-14 DIAGNOSIS — I255 Ischemic cardiomyopathy: Secondary | ICD-10-CM | POA: Diagnosis not present

## 2024-03-14 DIAGNOSIS — I7300 Raynaud's syndrome without gangrene: Secondary | ICD-10-CM | POA: Diagnosis not present

## 2024-03-14 DIAGNOSIS — Z23 Encounter for immunization: Secondary | ICD-10-CM | POA: Diagnosis not present

## 2024-03-14 DIAGNOSIS — Z955 Presence of coronary angioplasty implant and graft: Secondary | ICD-10-CM | POA: Diagnosis not present

## 2024-03-14 DIAGNOSIS — F419 Anxiety disorder, unspecified: Secondary | ICD-10-CM | POA: Diagnosis not present

## 2024-03-14 LAB — LAB REPORT - SCANNED: EGFR: 80

## 2024-04-17 ENCOUNTER — Encounter: Payer: Self-pay | Admitting: Cardiology

## 2024-04-17 ENCOUNTER — Ambulatory Visit: Attending: Cardiology | Admitting: Cardiology

## 2024-04-17 VITALS — BP 149/76 | Resp 16 | Ht 61.0 in | Wt 121.3 lb

## 2024-04-17 DIAGNOSIS — I10 Essential (primary) hypertension: Secondary | ICD-10-CM | POA: Diagnosis not present

## 2024-04-17 DIAGNOSIS — I2510 Atherosclerotic heart disease of native coronary artery without angina pectoris: Secondary | ICD-10-CM | POA: Insufficient documentation

## 2024-04-17 DIAGNOSIS — E7800 Pure hypercholesterolemia, unspecified: Secondary | ICD-10-CM | POA: Diagnosis not present

## 2024-04-17 NOTE — Patient Instructions (Signed)
 Medication Instructions:  STOP Aspirin   *If you need a refill on your cardiac medications before your next appointment, please call your pharmacy*  Lab Work: NONE If you have labs (blood work) drawn today and your tests are completely normal, you will receive your results only by: MyChart Message (if you have MyChart) OR A paper copy in the mail If you have any lab test that is abnormal or we need to change your treatment, we will call you to review the results.  Testing/Procedures: NONE  Follow-Up: At [facility], you and your health needs are our priority.  As part of our continuing mission to provide you with exceptional heart care, our providers are all part of one team.  This team includes your primary Cardiologist (physician) and Advanced Practice Providers or APPs (Physician Assistants and Nurse Practitioners) who all work together to provide you with the care you need, when you need it.  Your next appointment:   1 Year  Provider:   Gordy Bergamo, MD    We recommend signing up for the patient portal called MyChart.  Sign up information is provided on this After Visit Summary.  MyChart is used to connect with patients for Virtual Visits (Telemedicine).  Patients are able to view lab/test results, encounter notes, upcoming appointments, etc.  Non-urgent messages can be sent to your provider as well.   To learn more about what you can do with MyChart, go to [email or web address].

## 2024-04-17 NOTE — Progress Notes (Signed)
 EKG 04/17/2024 Cardiology Office Note:  .   Date:  04/17/2024  ID:  Alyssa James, DOB Aug 17, 1950, MRN [id number] PCP: Ransom Other, MD  [facility] Providers Cardiologist:  Gordy Bergamo, MD   History of Present Illness: .   Alyssa James is a 73 y.o. Patient with hypertension, hypercholesterolemia, coronary calcium  score in the 88 percentile in 2022, presented with acute anteroseptal STEMI on 07/22/2023, underwent stenting of the proximal and mid LAD and in a staged fashion on 07/25/2023 to the right coronary artery and also optimization of the LAD stent.  This is a 73-month office visit encounter for follow-up of coronary artery disease and recent myocardial infarction 8 months ago in December 2024.  She now has returned to full activity and remains asymptomatic and is accompanied by her daughter.  Cardiac Studies relevent.    Coronary angioplasty 07/25/23:   RCA 07/25/2023:  3.5 x 32 mm Synergy XD Prox and distal  3.0 x 12 mm Synergy XD DES  LAD 07/22/2023: 2.5 x 38 mm and a 2.5 x 16 mm Synergy stents     ECHOCARDIOGRAM COMPLETE 07/23/2023  Left ventricular ejection fraction, by estimation, is 45 to 50%. The left ventricle has mildly decreased function. The left ventricle demonstrates regional wall motion abnormalities: Septal , apical, distal anterior wall and inferior apical hypokinesis consistent with multivessel CAD.  The left ventricular internal cavity size was mildly dilated. Left ventricular diastolic parameters were normal.    Discussed the use of AI scribe software for clinical note transcription with the patient, who gave verbal consent to proceed.  History of Present Illness Alyssa James is a 73 year old female with coronary artery disease who presents for a follow-up visit.  She has not experienced any further episodes of chest pain since her myocardial infarction in December 2024 and continues to take clopidogrel  as prescribed. She wishes to address  bruising during this visit, which may be related to her medication.  Her blood pressure readings at home are variable, with systolic readings mostly below 140 mmHg and diastolic readings in the 70s. She attributes higher readings in the office to 'white coat syndrome.' She is currently taking amlodipine  5 mg, losartan  25 mg, and metoprolol  succinate 25 mg daily for blood pressure management.  She is on atorvastatin  80 mg daily for hyperlipidemia, with a recent LDL level of 70 mg/dL. She inquires about resuming vitamin D3 and omega-3 supplements, although her vitamin D levels were normal at the last check.  Labs    Lipoprotein (a)  Date/Time Value Ref Range Status  07/23/2023 03:32 AM 91.3 (H) <75.0 nmol/L Final    Comment:    (NOTE) Note:  Values greater than or equal to 75.0 nmol/L may       indicate an independent risk factor for CHD,       but must be evaluated with caution when applied       to non-Caucasian populations due to the       influence of genetic factors on Lp(a) across       ethnicities. Performed At: [facility] [address] [place], [place] [id number] Jennette Shorter MD Ey:[id number]     Care everywhere/Faxed External Labs:  Labs 03/15/2024:  Total cholesterol 144, triglycerides 96, HDL 57, LDL 70.  Vitamin D 43.6.  TSH normal at 2.16.  Serum glucose 86 mg, BUN 19, creatinine 0.77, eGFR 80 mL, potassium 3.8, LFTs normal.  Hb 13.1/HCT 39.8, platelets 195.  ROS  Review of Systems  Cardiovascular:  Negative for chest pain, dyspnea on exertion and leg swelling.   Physical Exam:   VS:  BP (!) 149/76 (BP Location: Left Arm, Patient Position: Sitting, Cuff Size: Normal)   Resp 16   Ht 5' 1 (1.549 m)   Wt 121 lb 4.8 oz (55 kg)   SpO2 96%   BMI 22.92 kg/m    Wt Readings from Last 3 Encounters:  04/17/24 121 lb 4.8 oz (55 kg)  12/30/23 119 lb (54 kg)  10/04/23 118 lb 3.2 oz (53.6 kg)    BP Readings from Last 3 Encounters:  04/17/24 (!) 149/76   12/30/23 (!) 149/74  10/04/23 (!) 168/76   Physical Exam Neck:     Vascular: No carotid bruit or JVD.  Cardiovascular:     Rate and Rhythm: Normal rate and regular rhythm.     Pulses: Intact distal pulses.     Heart sounds: Normal heart sounds. No murmur heard.    No gallop.  Pulmonary:     Effort: Pulmonary effort is normal.     Breath sounds: Normal breath sounds.  Abdominal:     General: Bowel sounds are normal.     Palpations: Abdomen is soft.  Musculoskeletal:     Right lower leg: No edema.     Left lower leg: No edema.    EKG:       EKG 10/04/2023: Normal sinus rhythm with rate of 65 bpm, normal axis, T wave inversion in V1 and V2, cannot exclude septal ischemia.  Compared to 07/26/2023, T wave inversion in V1 and V2 is new and previously minimal ST elevation with biphasic T wave was evident.  May represent evolution of prior infarct.   ASSESSMENT AND PLAN: .      ICD-10-CM   1. Coronary artery disease involving native coronary artery of native heart without angina pectoris  I25.10     2. Pure hypercholesterolemia  E78.00     3. Primary hypertension  I10     4. White coat syndrome with diagnosis of hypertension  I10       Assessment and Plan Assessment & Plan Atherosclerotic heart disease of native coronary artery, status post myocardial infarction, on dual antiplatelet therapy Status post myocardial infarction in December 2024. No recent chest pain reported. Continues on dual antiplatelet therapy with clopidogrel . - Discontinue aspirin  as it has been more than eight months since the myocardial infarction. - Continue clopidogrel  indefinitely.  Essential hypertension Blood pressure readings at home are generally well-controlled, with systolic readings mostly below 140 mmHg. White coat hypertension noted during visits. Current antihypertensive regimen includes amlodipine , losartan , and metoprolol , which appears effective. - Continue amlodipine  5 mg once daily. -  Continue losartan  25 mg once daily. - Continue metoprolol  succinate 25 mg once daily.  Pure hypercholesterolemia Cholesterol levels are well-managed with atorvastatin . Recent LDL level is at target (70 mg/dL). - Continue atorvastatin  80 mg once daily.  Follow-Up Follow-up plan discussed. - Schedule follow-up appointment in one year. If stable at next visit, consider releasing back to primary care.   Follow up: 1 year  Signed,  Gordy Bergamo, MD, [facility] 04/17/2024, 9:12 PM [facility] [address] [place], [place] [address] Phone: [phone]. Fax:  [phone]

## 2024-05-17 DIAGNOSIS — Z23 Encounter for immunization: Secondary | ICD-10-CM | POA: Diagnosis not present

## 2024-06-07 DIAGNOSIS — Z1231 Encounter for screening mammogram for malignant neoplasm of breast: Secondary | ICD-10-CM | POA: Diagnosis not present

## 2024-08-02 ENCOUNTER — Other Ambulatory Visit: Payer: Self-pay | Admitting: Cardiology
